# Patient Record
Sex: Female | Born: 1985 | Race: Black or African American | Hispanic: No | Marital: Single | Smoking: Never smoker
Health system: Southern US, Community
[De-identification: ages and names within clinical notes are randomized; demographics above are authoritative.]

## PROBLEM LIST (undated history)

## (undated) DIAGNOSIS — I10 Essential (primary) hypertension: Secondary | ICD-10-CM

## (undated) HISTORY — PX: GASTRIC BYPASS: SHX52

---

## 2007-01-12 ENCOUNTER — Encounter: Admission: RE | Admit: 2007-01-12 | Discharge: 2007-01-12 | Payer: Self-pay | Admitting: Emergency Medicine

## 2007-01-12 IMAGING — US US ABDOMEN COMPLETE
1 series · 14 of 25 positions shown · non-contrast
Comparison: none

CLINICAL DATA: Abdominal pain. 
 ABDOMEN ULTRASOUND:
TECHNIQUE: Complete abdominal ultrasound examination was performed including evaluation of the liver, gallbladder, bile ducts, pancreas, kidneys, spleen, IVC, and abdominal aorta.
 The gallbladder is well seen and no gallstones are noted.  The liver has a normal echogenic pattern.  The common bile duct is normal measuring 4.2 mm in diameter.  The IVC, pancreas, and spleen appear normal.  No hydronephrosis is noted.  The right kidney measures 12.2 cm sagittally with the left kidney measuring 11.4 cm.  The abdominal aorta is normal in caliber.

[Series 1: us abdomen complete · 0.28mm/px · 14 of 56 slices shown]
[im 1/56]
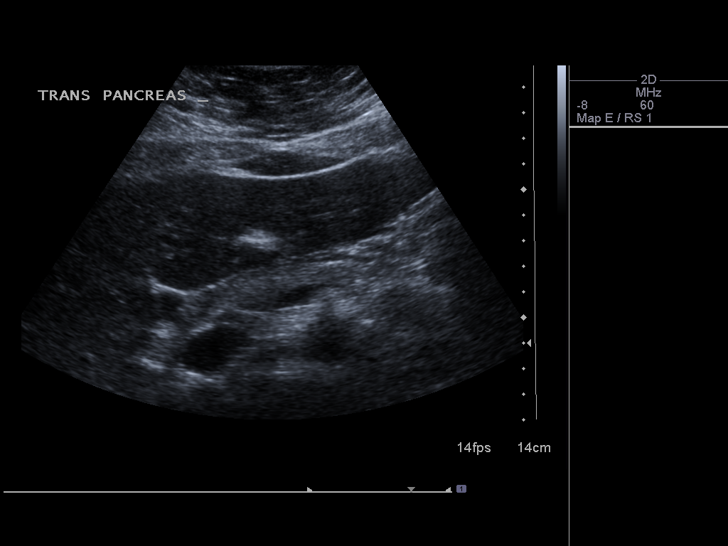
[im 5/56]
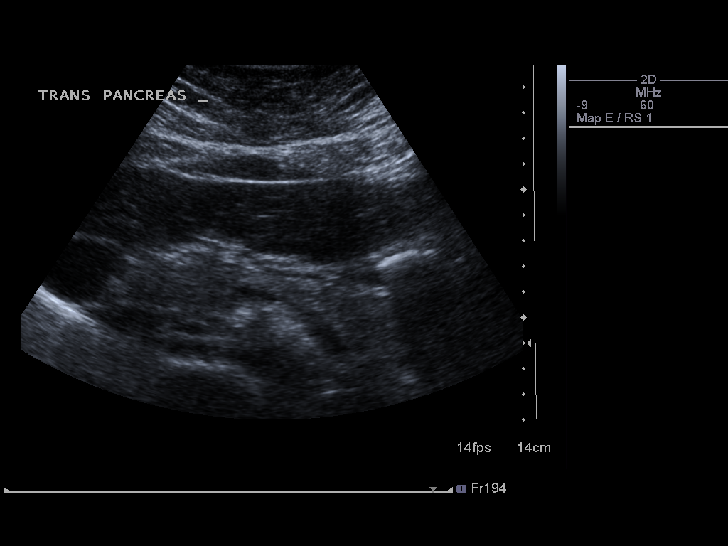
[im 10/56]
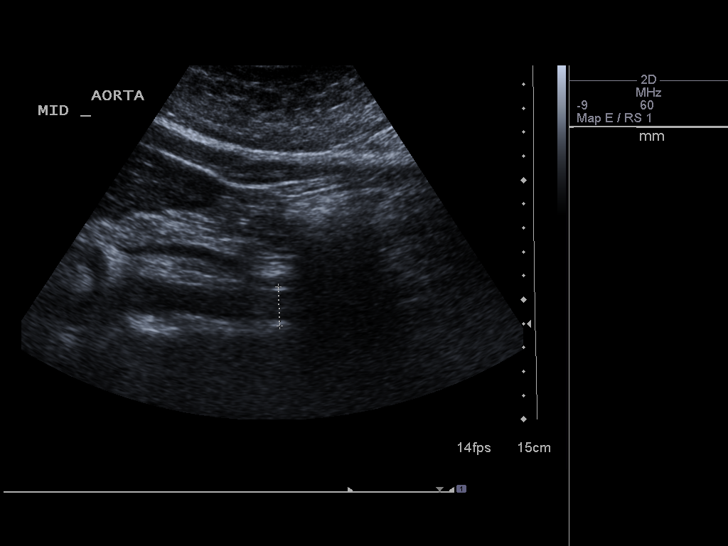
[im 14/56]
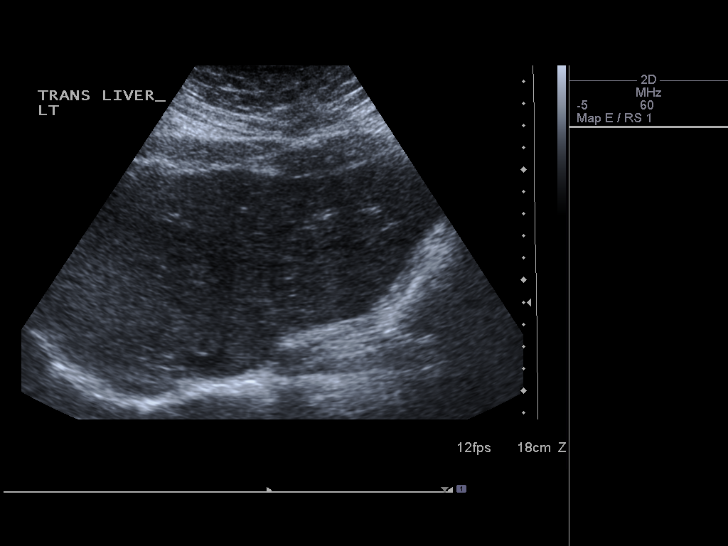
[im 19/56]
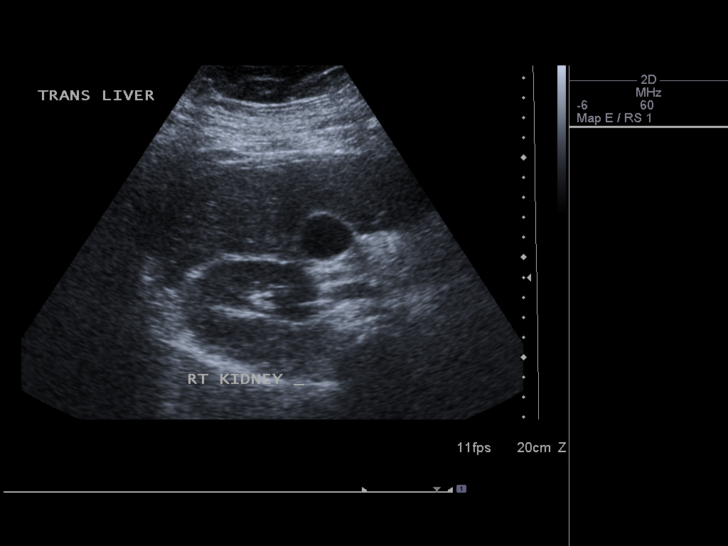
[im 21/56]
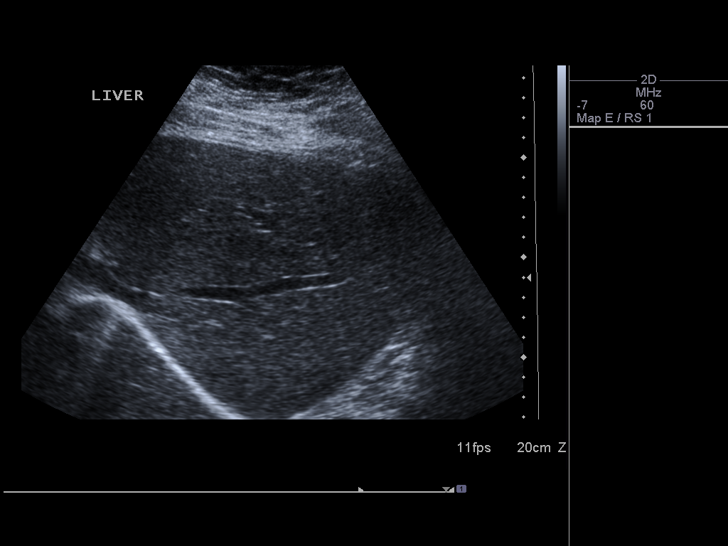
[im 26/56]
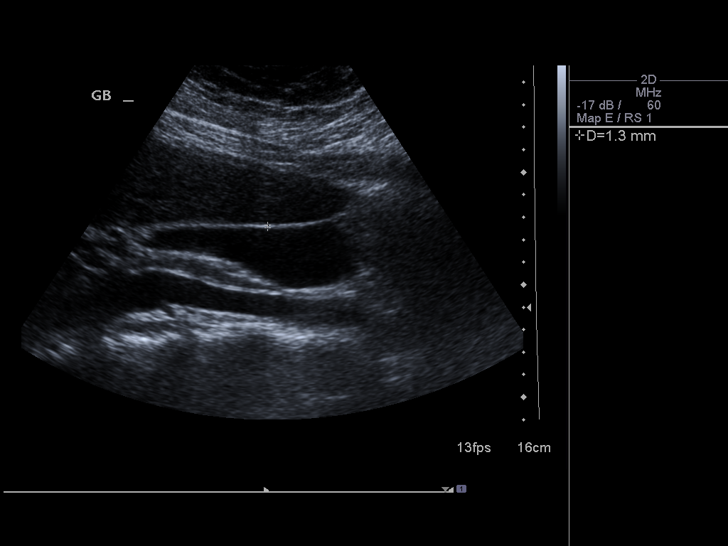
[im 30/56]
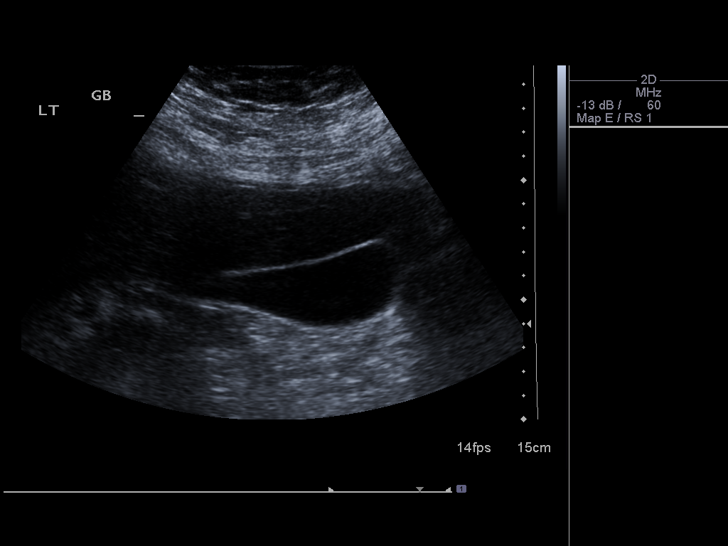
[im 35/56]
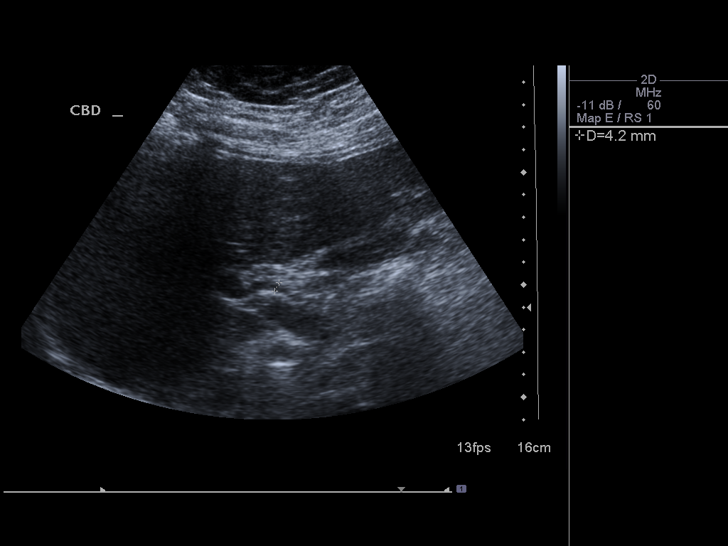
[im 37/56]
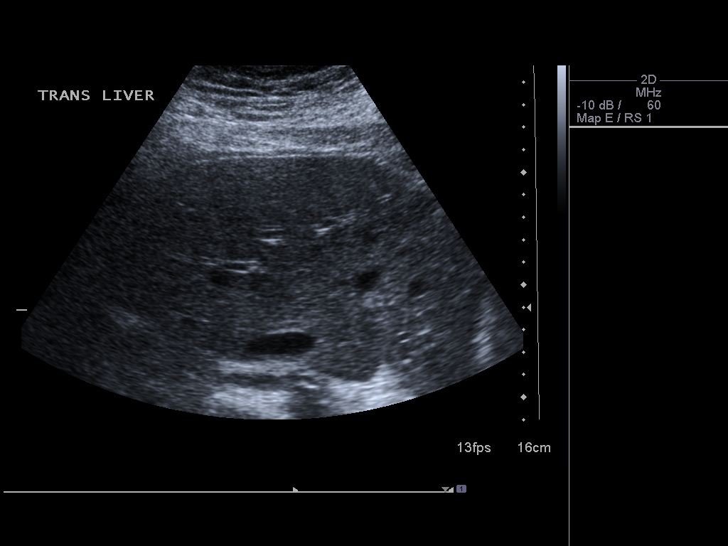
[im 42/56]
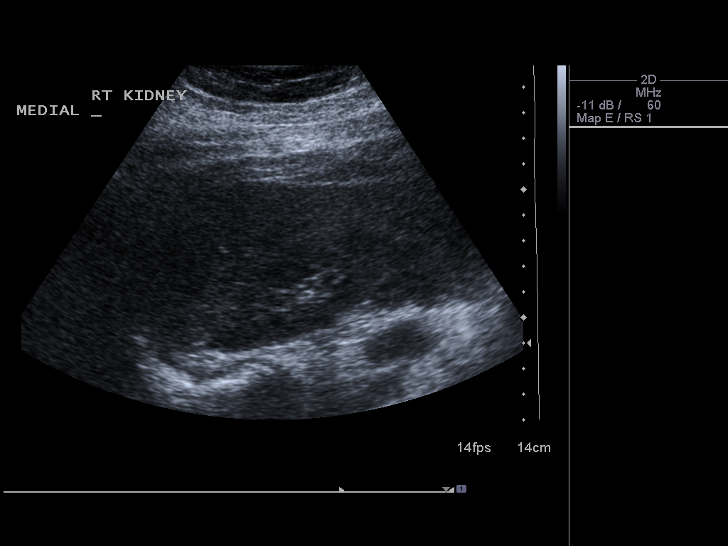
[im 46/56]
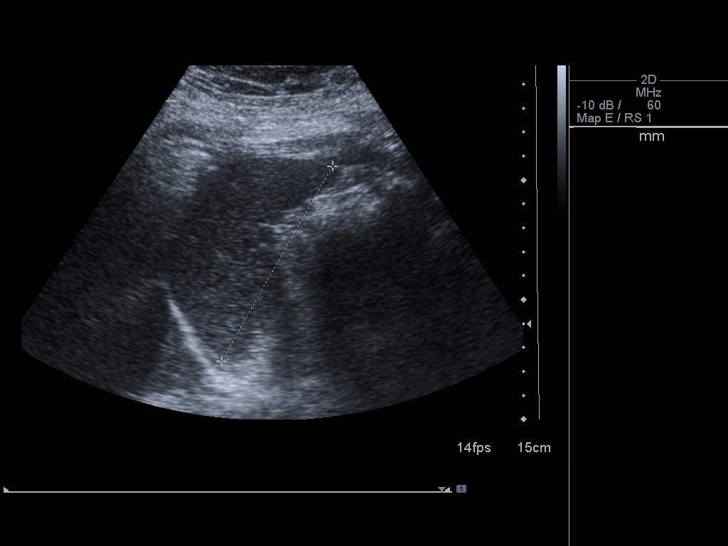
[im 51/56]
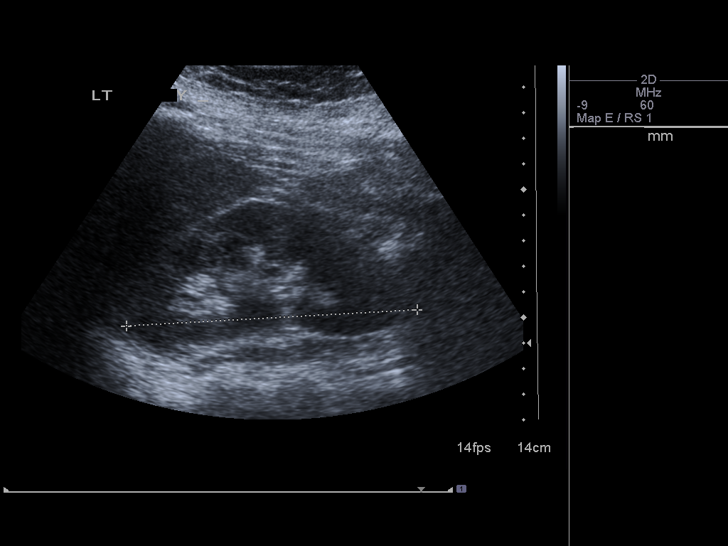
[im 56/56]
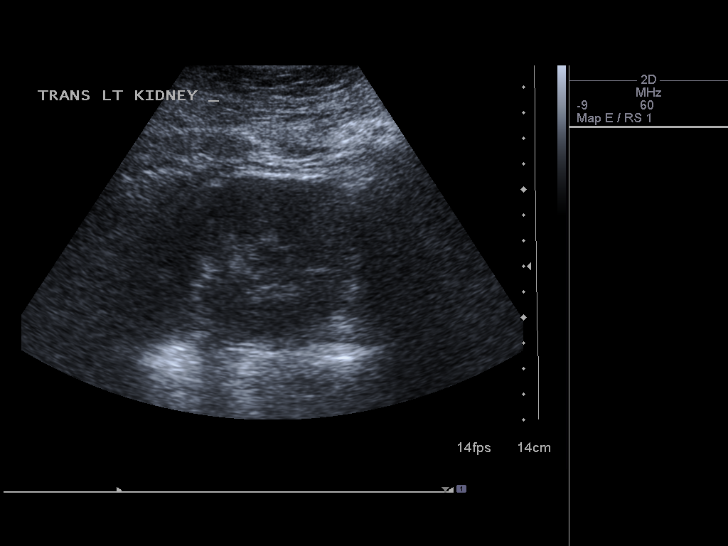

[14 of 25 positions shown; findings below may reference images not displayed]

IMPRESSION: Negative abdominal ultrasound.  No gallstones.

## 2009-04-03 ENCOUNTER — Encounter: Admission: RE | Admit: 2009-04-03 | Discharge: 2009-04-03 | Payer: Self-pay | Admitting: Infectious Diseases

## 2009-04-03 IMAGING — CR DG CHEST 1V
1 series · 1 of 1 positions shown · non-contrast
Comparison: None

CLINICAL DATA: Positive PPD, no symptoms

CHEST - 1 VIEW

[view not recorded]
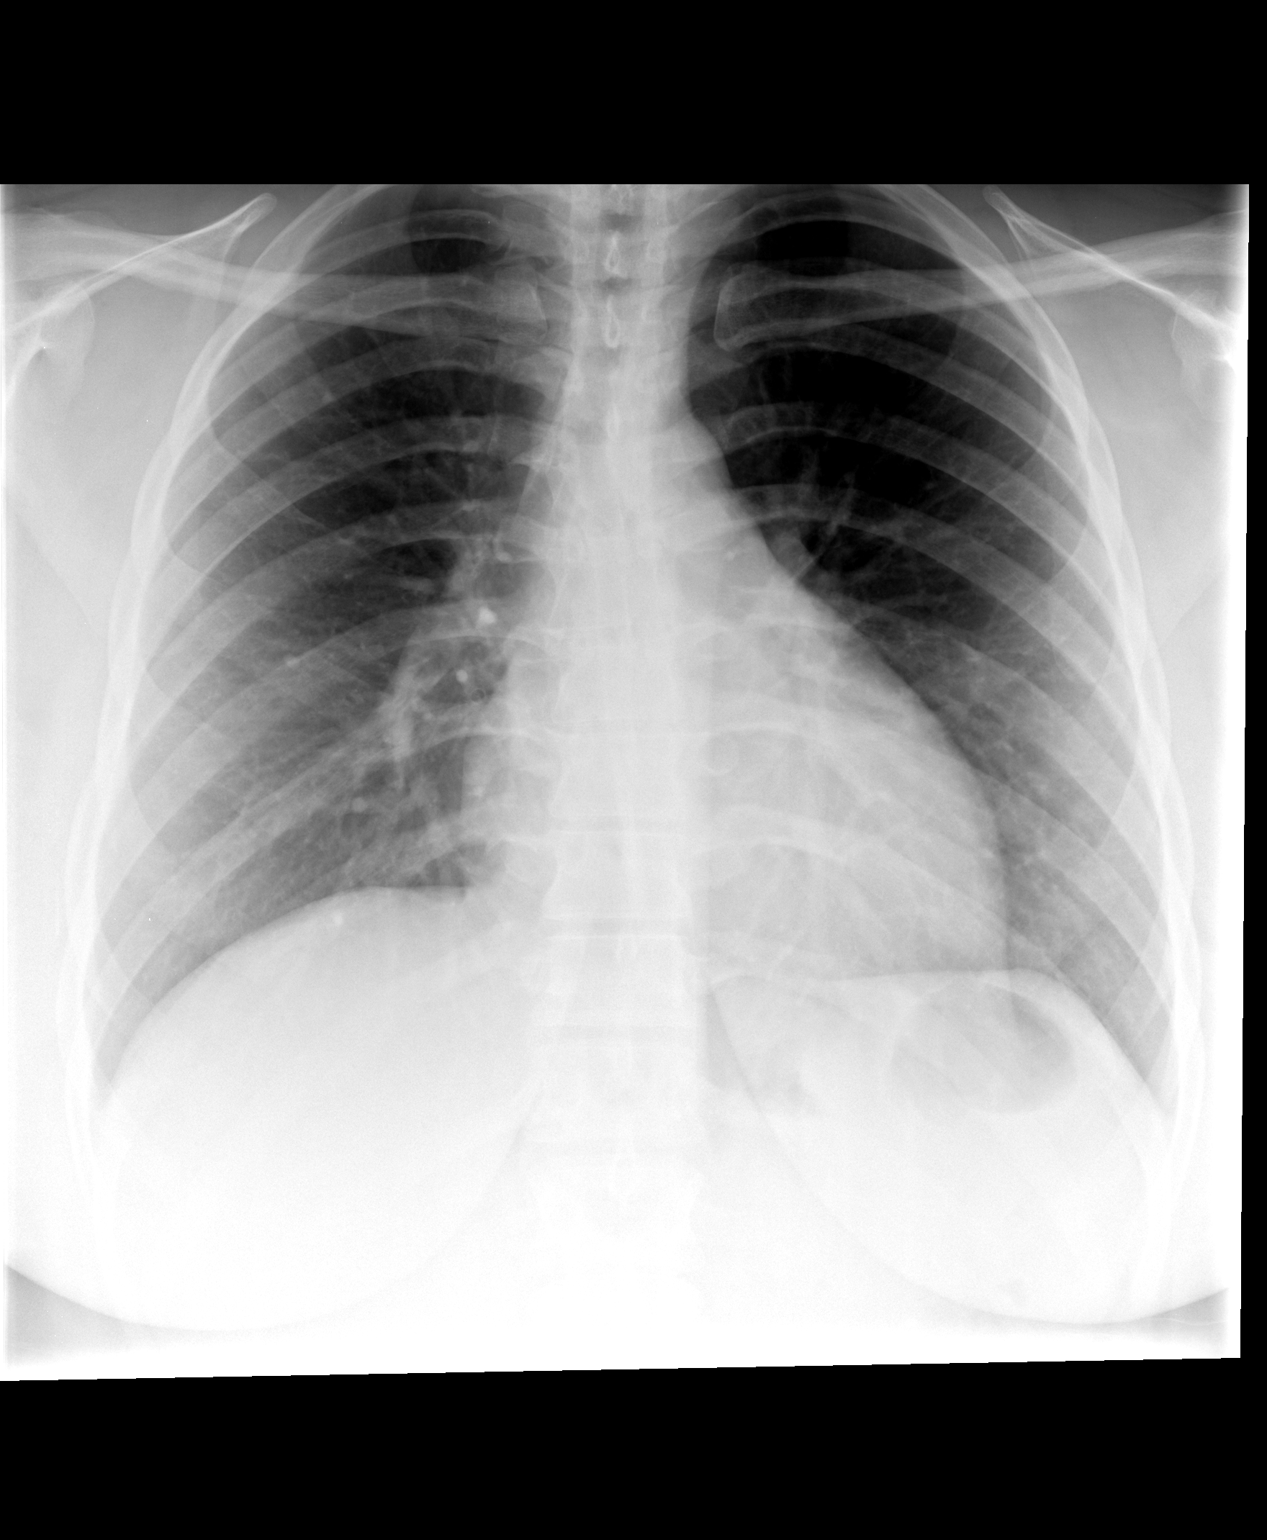

[1 of 1 positions shown; findings below may reference images not displayed]

FINDINGS: No active infiltrate or effusion is seen.  A small nodule
in the right mid lung base possibly in the right upper lung field
probably represent faintly calcified granulomas.  No mediastinal
abnormality is noted.  The heart is within normal limits in size.
No bony abnormality is seen.
IMPRESSION: No active lung disease.  Probable small granuloma in the right mid
lung and possibly in the right upper lung field.

## 2020-07-27 ENCOUNTER — Encounter (HOSPITAL_BASED_OUTPATIENT_CLINIC_OR_DEPARTMENT_OTHER): Payer: Self-pay | Admitting: Emergency Medicine

## 2020-07-27 ENCOUNTER — Other Ambulatory Visit: Payer: Self-pay

## 2020-07-27 ENCOUNTER — Emergency Department (HOSPITAL_BASED_OUTPATIENT_CLINIC_OR_DEPARTMENT_OTHER)
Admission: EM | Admit: 2020-07-27 | Discharge: 2020-07-27 | Disposition: A | Discharge status: Home / Self Care | Payer: BC Managed Care – PPO | Attending: Emergency Medicine | Admitting: Emergency Medicine

## 2020-07-27 DIAGNOSIS — T7840XA Allergy, unspecified, initial encounter: Secondary | ICD-10-CM | POA: Insufficient documentation

## 2020-07-27 MED ORDER — ALBUTEROL SULFATE HFA 108 (90 BASE) MCG/ACT IN AERS
2.0000 | INHALATION_SPRAY | Freq: Once | RESPIRATORY_TRACT | Status: AC
  Administered 2020-07-27: 2 via RESPIRATORY_TRACT
  Filled 2020-07-27: qty 6.7

## 2020-07-27 MED ORDER — EPINEPHRINE 0.3 MG/0.3ML IJ SOAJ
0.3000 mg | INTRAMUSCULAR | 1 refills | Status: AC | PRN
Start: 2020-07-27 — End: ?

## 2020-07-27 NOTE — ED Triage Notes (Signed)
Pt via pov from home with allergic reaction. 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 The reaction started around 4PM; she began to feel itchy and sob and felt her throat swell; then her eyes started running. Pt took 2 benadryl tablets and came here. Pt has hx of the same, used to have an epi pen, but has not had a reaction in several years and gave up her epi pen. Pt alert & oriented, states she is feeling a little better. NAD noted at this time.

## 2020-07-27 NOTE — ED Notes (Signed)
Pt discharged home after verbalizing understanding of discharge instructions; nad noted. 

## 2020-07-27 NOTE — ED Provider Notes (Signed)
[facility] EMERGENCY DEPT Provider Note   CSN: [id number] Arrival date & time: 07/27/20  1633     History Chief Complaint  Patient presents with  . Allergic Reaction    Sara Buckley is a 35 y.o. female.  HPI 35 year old female presents with allergic reaction.  At 4 PM she started noticing a little bit of chest tightness and swelling and itching and redness around her eyes.  This is how her allergic reactions typically occur.  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  She has a little bit of dyspnea.  She had some throat tightness though after taking 1 and then another Benadryl that has resolved.  She is feeling better though not all the way back to normal.  She does not have an EpiPen any longer.  No rash anywhere else.  History reviewed. No pertinent past medical history.  There are no problems to display for this patient.   History reviewed. No pertinent surgical history.   OB History   No obstetric history on file.     History reviewed. No pertinent family history.  Social History   Tobacco Use  . Smoking status: Never Smoker  . Smokeless tobacco: Never Used  Vaping Use  . Vaping Use: Never used  Substance Use Topics  . Alcohol use: Not Currently  . Drug use: Not Currently    Home Medications Prior to Admission medications   Medication Sig Start Date End Date Taking? Authorizing Provider  EPINEPHrine 0.3 mg/0.3 mL IJ SOAJ injection Inject 0.3 mg into the muscle as needed for anaphylaxis. 07/27/20  Yes Pricilla Loveless, MD    Allergies    Patient has no known allergies.  Review of Systems   Review of Systems  HENT: Positive for facial swelling.   Respiratory: Positive for chest tightness and shortness of breath. Negative for wheezing.   Gastrointestinal: Negative for vomiting.  Skin: Positive for rash.  All other systems  reviewed and are negative.   Physical Exam Updated Vital Signs BP (!) 168/95 (BP Location: Right Arm)   Pulse 100   Temp 98.1 F (36.7 C) (Oral)   Resp 20   Ht 5\' 8"  (1.727 m)   Wt (!) 143.3 kg   LMP 07/12/2020 (Approximate)   SpO2 99%   BMI 48.05 kg/m   Physical Exam Vitals and nursing note reviewed.  Constitutional:      General: She is not in acute distress.    Appearance: She is well-developed. She is obese. She is not ill-appearing or diaphoretic.  HENT:     Head: Normocephalic and atraumatic.     Comments: Mild periorbital swelling    Right Ear: External ear normal.     Left Ear: External ear normal.     Nose: Nose normal.     Mouth/Throat:     Comments: Normal tongue/lip size. No obvious oropharyngeal swelling. Normal voice Eyes:     General:        Right eye: No discharge.        Left eye: No discharge.  Cardiovascular:     Rate and Rhythm: Normal rate and regular rhythm.     Heart sounds: Normal heart sounds.  Pulmonary:     Effort: Pulmonary effort is normal.     Breath sounds: Normal breath sounds.     Comments: Mildly decreased BS at bases, no  obvious wheezes Abdominal:     Palpations: Abdomen is soft.     Tenderness: There is no abdominal tenderness.  Skin:    General: Skin is warm and dry.     Findings: No rash.  Neurological:     Mental Status: She is alert.  Psychiatric:        Mood and Affect: Mood is not anxious.     ED Results / Procedures / Treatments   Labs (all labs ordered are listed, but only abnormal results are displayed) Labs Reviewed - No data to display  EKG None  Radiology No results found.  Procedures Procedures   Medications Ordered in ED Medications  albuterol (VENTOLIN HFA) 108 (90 Base) MCG/ACT inhaler 2 puff (2 puffs Inhalation Given 07/27/20 1711)    ED Course  I have reviewed the triage vital signs and the nursing notes.  Pertinent labs & imaging results that were available during my care of the patient  were reviewed by me and considered in my medical decision making (see chart for details).    MDM Rules/Calculators/A&P                          Patient is feeling better after observation in the ED.  She declined any other type of meds such as prednisone.  I do not hear any overt wheezing but she want to try an inhaler because of her chest tightness.  We discussed EpiPen and she would like to have a prescription given to her.  I think this is reasonable given her not allergy.  I do not think she is in anaphylaxis.  She would like to decline steroids and we discussed continue to use Benadryl as needed.  She has been observed now for over an hour and feels well enough for discharge as she has continued to improve. Final Clinical Impression(s) / ED Diagnoses Final diagnoses:  Allergic reaction, initial encounter    Rx / DC Orders ED Discharge Orders         Ordered    EPINEPHrine 0.3 mg/0.3 mL IJ SOAJ injection  As needed        07/27/20 1804           Pricilla Loveless, MD 07/27/20 1806

## 2020-07-27 NOTE — Discharge Instructions (Signed)
Continue to take Benadryl as needed for rash, itching, swelling.  If you develop trouble breathing, inability to swallow, throat closing sensation, or any other new/concerning symptoms then return to the ER for evaluation.

## 2021-04-06 ENCOUNTER — Other Ambulatory Visit: Payer: Self-pay

## 2021-04-06 ENCOUNTER — Encounter (HOSPITAL_BASED_OUTPATIENT_CLINIC_OR_DEPARTMENT_OTHER): Payer: Self-pay

## 2021-04-06 DIAGNOSIS — R202 Paresthesia of skin: Secondary | ICD-10-CM | POA: Diagnosis present

## 2021-04-06 DIAGNOSIS — R471 Dysarthria and anarthria: Secondary | ICD-10-CM | POA: Diagnosis present

## 2021-04-06 DIAGNOSIS — R1310 Dysphagia, unspecified: Secondary | ICD-10-CM | POA: Diagnosis present

## 2021-04-06 DIAGNOSIS — I10 Essential (primary) hypertension: Secondary | ICD-10-CM | POA: Diagnosis present

## 2021-04-06 DIAGNOSIS — R4781 Slurred speech: Secondary | ICD-10-CM | POA: Diagnosis present

## 2021-04-06 DIAGNOSIS — R200 Anesthesia of skin: Secondary | ICD-10-CM | POA: Diagnosis present

## 2021-04-06 DIAGNOSIS — Z9884 Bariatric surgery status: Secondary | ICD-10-CM

## 2021-04-06 DIAGNOSIS — R55 Syncope and collapse: Secondary | ICD-10-CM | POA: Diagnosis not present

## 2021-04-06 DIAGNOSIS — R4701 Aphasia: Secondary | ICD-10-CM | POA: Diagnosis present

## 2021-04-06 DIAGNOSIS — Z6841 Body Mass Index (BMI) 40.0 and over, adult: Secondary | ICD-10-CM

## 2021-04-06 DIAGNOSIS — Z823 Family history of stroke: Secondary | ICD-10-CM

## 2021-04-06 DIAGNOSIS — E785 Hyperlipidemia, unspecified: Secondary | ICD-10-CM | POA: Diagnosis present

## 2021-04-06 DIAGNOSIS — Z87892 Personal history of anaphylaxis: Secondary | ICD-10-CM

## 2021-04-06 DIAGNOSIS — Z91010 Allergy to peanuts: Secondary | ICD-10-CM

## 2021-04-06 DIAGNOSIS — Z20822 Contact with and (suspected) exposure to covid-19: Secondary | ICD-10-CM | POA: Diagnosis present

## 2021-04-06 DIAGNOSIS — I639 Cerebral infarction, unspecified: Principal | ICD-10-CM | POA: Diagnosis present

## 2021-04-06 DIAGNOSIS — R29810 Facial weakness: Secondary | ICD-10-CM | POA: Diagnosis present

## 2021-04-06 DIAGNOSIS — R29700 NIHSS score 0: Secondary | ICD-10-CM | POA: Diagnosis present

## 2021-04-06 DIAGNOSIS — Z888 Allergy status to other drugs, medicaments and biological substances status: Secondary | ICD-10-CM

## 2021-04-06 NOTE — ED Triage Notes (Signed)
Patient here POV from Home with Numbness.  At 1100 she began to experience Left-Sided Facial Numbness/Tingling that she originally attributed to an Allergic Reaction. Patient attempted to drink Fluids with Benadryl but noticed Difficulty Swallowing.   No Symptoms at this Time. Headache this AM that has since subsided. No Extremity Numbness. NIH 0 at this Time. No Neurological Deficits noted.  No N/V/D. No Fevers.  NAD Noted during Triage. A&Ox4. GCS 15. Ambulatory.

## 2021-04-07 ENCOUNTER — Inpatient Hospital Stay (HOSPITAL_BASED_OUTPATIENT_CLINIC_OR_DEPARTMENT_OTHER)
Admission: EM | Admit: 2021-04-07 | Discharge: 2021-04-09 | DRG: 065 | Disposition: A | Discharge status: Home / Self Care | Payer: BC Managed Care – PPO | Attending: Internal Medicine | Admitting: Internal Medicine

## 2021-04-07 ENCOUNTER — Emergency Department (HOSPITAL_COMMUNITY): Payer: BC Managed Care – PPO

## 2021-04-07 ENCOUNTER — Observation Stay (HOSPITAL_COMMUNITY): Payer: BC Managed Care – PPO

## 2021-04-07 ENCOUNTER — Encounter (HOSPITAL_COMMUNITY): Payer: Self-pay | Admitting: Internal Medicine

## 2021-04-07 ENCOUNTER — Emergency Department (HOSPITAL_BASED_OUTPATIENT_CLINIC_OR_DEPARTMENT_OTHER): Payer: BC Managed Care – PPO

## 2021-04-07 DIAGNOSIS — I639 Cerebral infarction, unspecified: Secondary | ICD-10-CM | POA: Diagnosis not present

## 2021-04-07 DIAGNOSIS — R55 Syncope and collapse: Secondary | ICD-10-CM | POA: Insufficient documentation

## 2021-04-07 DIAGNOSIS — I6389 Other cerebral infarction: Secondary | ICD-10-CM | POA: Diagnosis not present

## 2021-04-07 DIAGNOSIS — I10 Essential (primary) hypertension: Secondary | ICD-10-CM | POA: Diagnosis present

## 2021-04-07 HISTORY — DX: Morbid (severe) obesity due to excess calories: E66.01

## 2021-04-07 HISTORY — DX: Essential (primary) hypertension: I10

## 2021-04-07 LAB — BASIC METABOLIC PANEL
Anion gap: 10 (ref 5–15)
BUN: 14 mg/dL (ref 6–20)
CO2: 25 mmol/L (ref 22–32)
Calcium: 8.6 mg/dL — ABNORMAL LOW (ref 8.9–10.3)
Chloride: 104 mmol/L (ref 98–111)
Creatinine, Ser: 0.81 mg/dL (ref 0.44–1.00)
GFR, Estimated: >60 mL/min (ref >60)
Glucose, Bld: 90 mg/dL (ref 70–99)
Potassium: 3.4 mmol/L — ABNORMAL LOW (ref 3.5–5.1)
Sodium: 139 mmol/L (ref 135–145)

## 2021-04-07 LAB — CBC WITH DIFFERENTIAL/PLATELET
Abs Immature Granulocytes: 0.02 10*3/uL (ref 0.00–0.07)
Basophils Absolute: 0 10*3/uL (ref 0.0–0.1)
Basophils Relative: 0 %
Eosinophils Absolute: 0 10*3/uL (ref 0.0–0.5)
Eosinophils Relative: 1 %
HCT: 34 % — ABNORMAL LOW (ref 36.0–46.0)
Hemoglobin: 10.4 g/dL — ABNORMAL LOW (ref 12.0–15.0)
Immature Granulocytes: 0 %
Lymphocytes Relative: 38 %
Lymphs Abs: 3.1 10*3/uL (ref 0.7–4.0)
MCH: 25.1 pg — ABNORMAL LOW (ref 26.0–34.0)
MCHC: 30.6 g/dL (ref 30.0–36.0)
MCV: 81.9 fL (ref 80.0–100.0)
Monocytes Absolute: 0.4 10*3/uL (ref 0.1–1.0)
Monocytes Relative: 5 %
Neutro Abs: 4.5 10*3/uL (ref 1.7–7.7)
Neutrophils Relative %: 56 %
Platelets: 275 10*3/uL (ref 150–400)
RBC: 4.15 MIL/uL (ref 3.87–5.11)
RDW: 14.7 % (ref 11.5–15.5)
WBC: 8 10*3/uL (ref 4.0–10.5)
nRBC: 0 % (ref 0.0–0.2)

## 2021-04-07 LAB — RESP PANEL BY RT-PCR (FLU A&B, COVID) ARPGX2
Influenza A by PCR: NEGATIVE
Influenza B by PCR: NEGATIVE
SARS Coronavirus 2 by RT PCR: NEGATIVE

## 2021-04-07 LAB — HCG, SERUM, QUALITATIVE: Preg, Serum: NEGATIVE

## 2021-04-07 LAB — ECHOCARDIOGRAM COMPLETE BUBBLE STUDY
Area-P 1/2: 3.48 cm2
S' Lateral: 3.2 cm

## 2021-04-07 IMAGING — MR MR HEAD WO/W CM
7 series · 48 of 48 positions shown · IV contrast (gadavist)
Comparison: Head CT [IP] hours today.

CLINICAL DATA: 35-year-old female with headache and neurologic
deficit. Query demyelinating disease or venous sinus thrombosis.

EXAM:
MRI HEAD WITHOUT AND WITH CONTRAST
MR VENOGRAM HEAD WITHOUT AND WITH CONTRAST
TECHNIQUE: Multiplanar, multi-echo pulse sequences of the brain and surrounding
structures were acquired without and with intravenous contrast.
Angiographic images of the intracranial venous structures were
acquired using MRV technique without and with intravenous contrast.
CONTRAST:  10mL GADAVIST GADOBUTROL 1 MMOL/ML IV SOLN

[Series 5: DWI · axial · 3.0mm · 0.92mm/px · z∈[-75,+89]mm · 15 of 112 slices shown (1 of 4)]
[im 1/112]
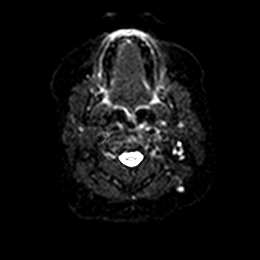
[im 8/112]
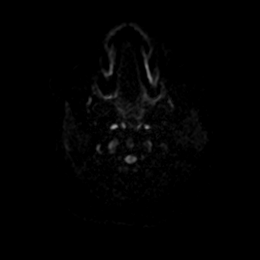
[im 16/112]
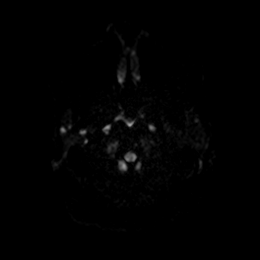
[im 24/112]
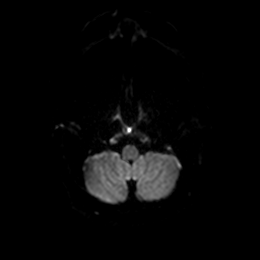
[im 32/112]
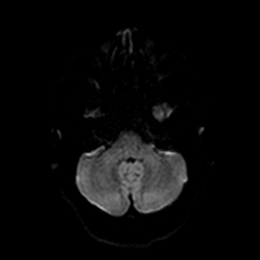
[im 40/112]
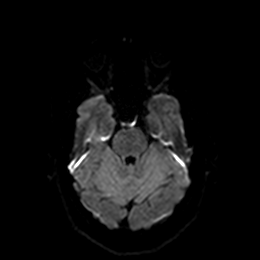
[im 48/112]
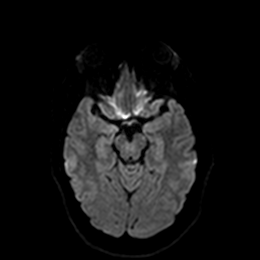
[im 56/112]
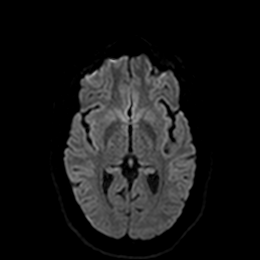
[im 64/112]
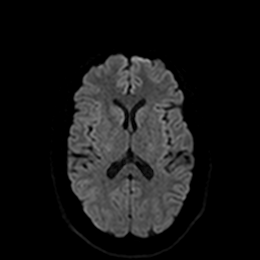
[im 72/112]
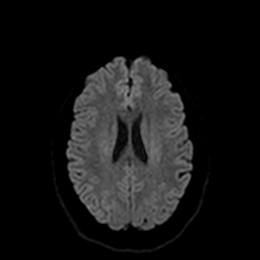
[im 80/112]
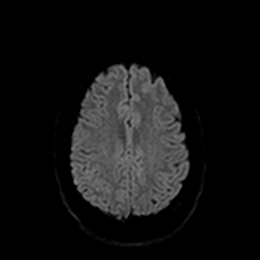
[im 88/112]
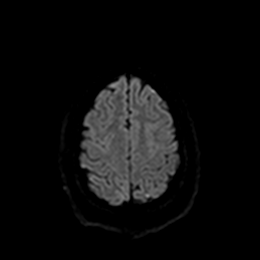
[im 96/112]
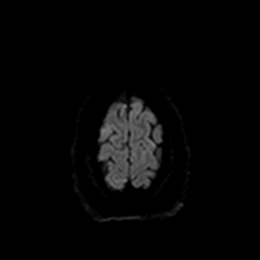
[im 104/112]
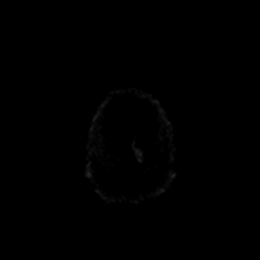
[im 112/112]
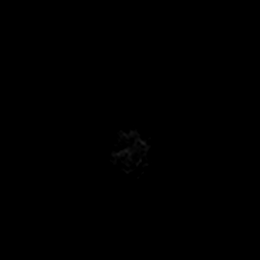

[Series 6: DWI · axial · 3.0mm · 0.92mm/px · z∈[-75,+89]mm · 7 of 56 slices shown (2 of 4)]
[im 1/56]
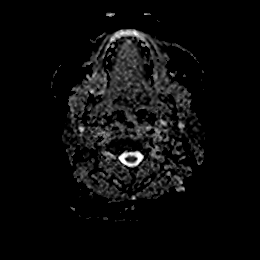
[im 10/56]
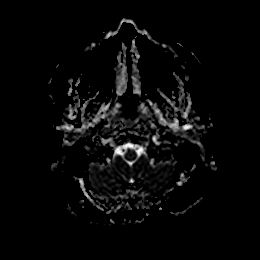
[im 19/56]
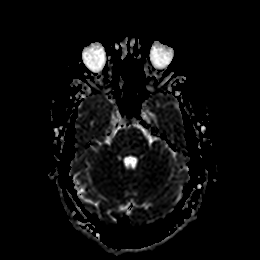
[im 28/56]
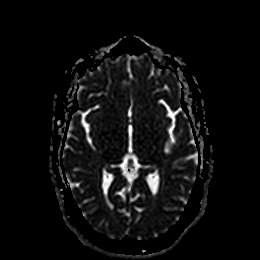
[im 37/56]
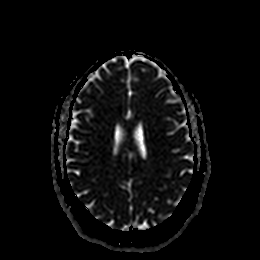
[im 46/56]
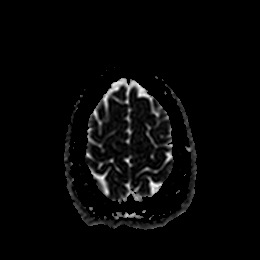
[im 56/56]
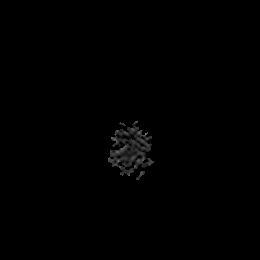

[Series 7: DWI · coronal · 4.0mm · 0.88mm/px · 10 of 82 slices shown (3 of 4)]
[im 1/82]
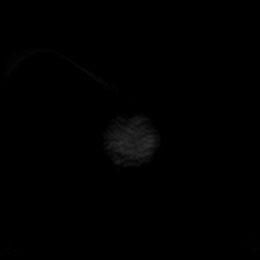
[im 10/82]
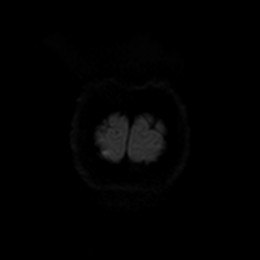
[im 19/82]
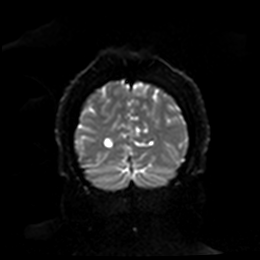
[im 28/82]
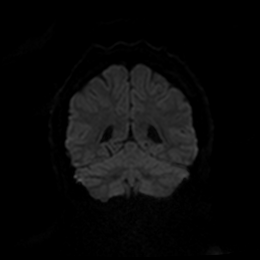
[im 37/82]
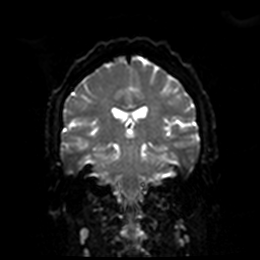
[im 46/82]
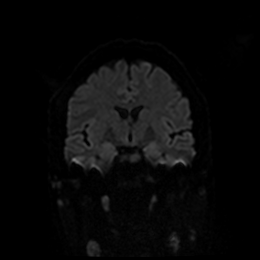
[im 55/82]
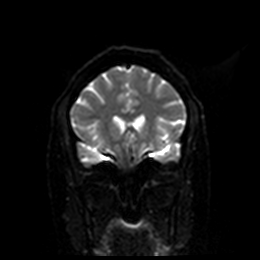
[im 64/82]
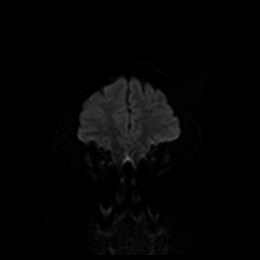
[im 73/82]
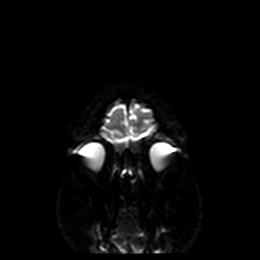
[im 82/82]
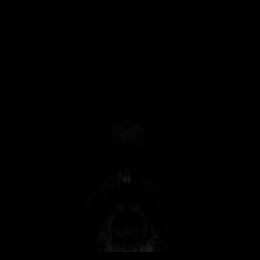

[Series 8: DWI · coronal · 4.0mm · 0.88mm/px · 5 of 41 slices shown (4 of 4)]
[im 1/41]
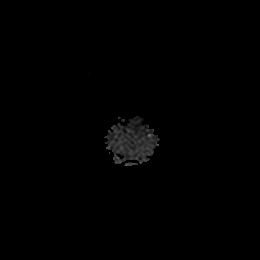
[im 11/41]
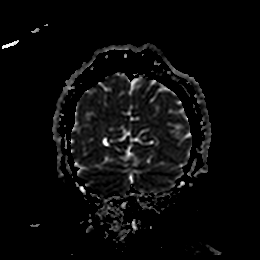
[im 21/41]
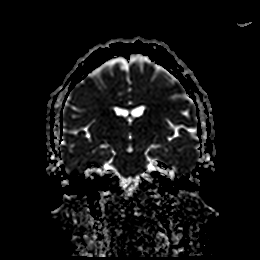
[im 31/41]
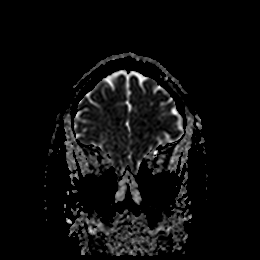
[im 41/41]
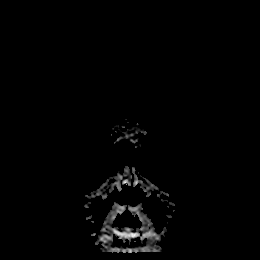

[Series 9: T1 · sagittal · 5.0mm · 0.78mm/px · 3 of 27 slices shown]
[im 1/27]
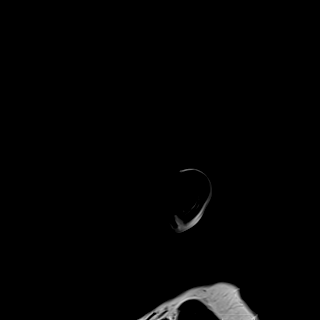
[im 14/27]
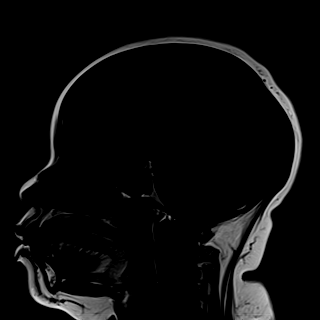
[im 27/27]
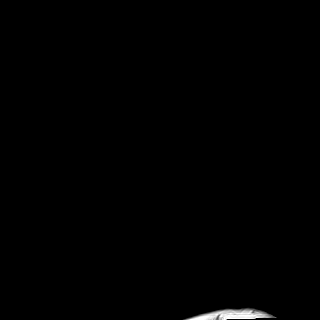

[Series 10: T2 · axial · 5.0mm · 0.75mm/px · z∈[-73,+94]mm · 4 of 29 slices shown]
[im 1/29]
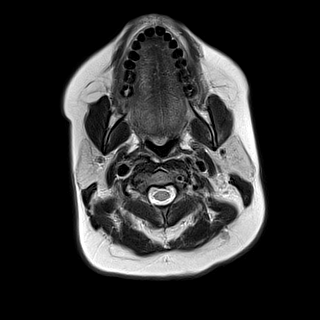
[im 10/29]
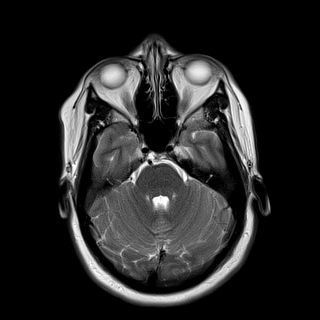
[im 19/29]
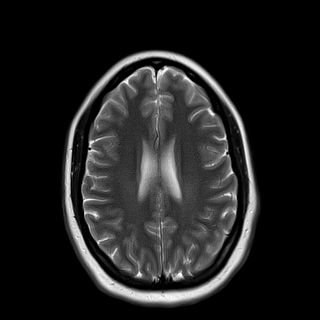
[im 29/29]
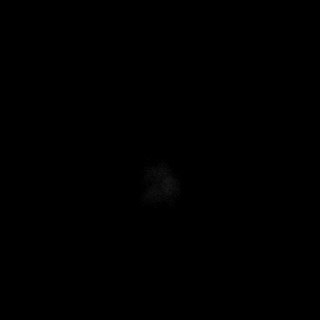

[Series 11: FLAIR · axial · 5.0mm · 0.47mm/px · z∈[-76,+92]mm · 4 of 29 slices shown]
[im 1/29]
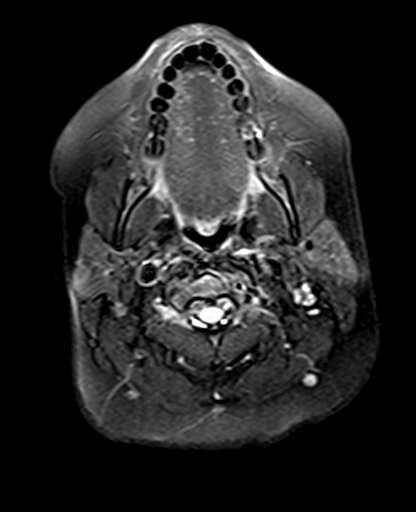
[im 10/29]
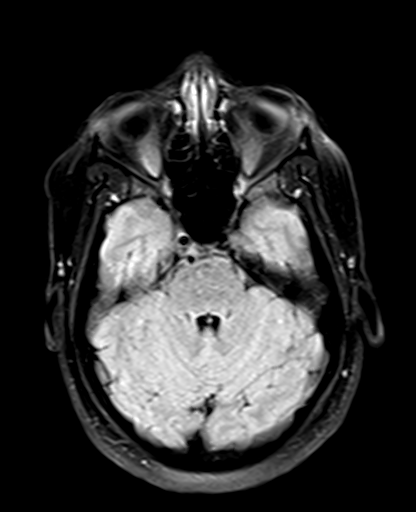
[im 19/29]
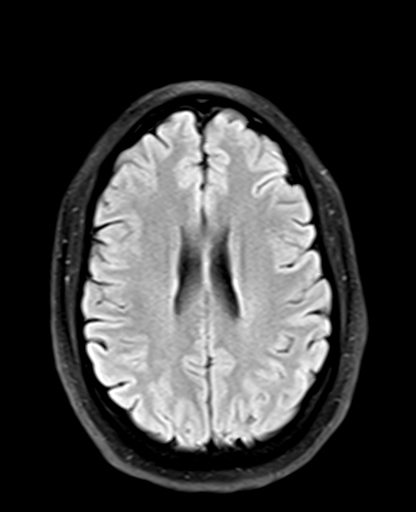
[im 29/29]
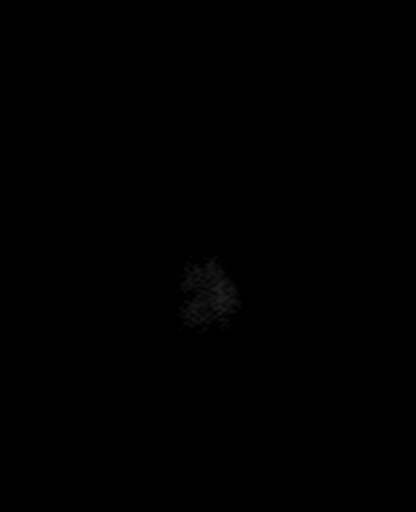

[48 of 48 positions shown; findings below may reference images not displayed]

FINDINGS: MRI HEAD WITHOUT AND WITH CONTRAST

Brain: Small focus of cortical restricted diffusion (approximately
10 mm series 5, image 98) in the right middle frontal gyrus (series
7, image 65), with no convincing T2 or FLAIR hyperintensity at this
time (series 11, image 21). But there is a trace focus of
susceptibility there on series 2, image 77. No corresponding CT
abnormality earlier today. Following contrast, no abnormal
enhancement.

No other convincing diffusion abnormality.

No midline shift, mass effect, evidence of mass lesion,
ventriculomegaly, extra-axial collection or acute intracranial
hemorrhage. Cervicomedullary junction and pituitary are within
normal limits. Normal cerebral volume.

Aside from the abnormal DWI, gray and white matter signal is within
normal limits throughout the brain. Only minimal nonspecific white
matter T2 and FLAIR hyperintensity (posterior left corona radiata
series 11, image 18). No chronic cortical encephalomalacia. No
abnormal enhancement identified. No dural thickening identified.

Vascular: Major dural venous sinuses seem to be enhancing and
patent. See dedicated MRV findings below. Major intracranial
vascular flow voids are preserved.

Skull and upper cervical spine: Negative visible cervical spine.
Visualized bone marrow signal is within normal limits.

Sinuses/Orbits: Negative.  Paranasal sinuses and mastoids are clear.

Other: Visible internal auditory structures appear normal.

MR VENOGRAM HEAD WITHOUT AND WITH CONTRAST

Pre contrast 2D and 3D intracranial MRV. Preserved flow signal in
the superior sagittal sinus, torcula, straight sinus, vein of ARK,
internal cerebral veins, bilateral transverse sinuses, sigmoid
sinuses and IJ bulbs (right transverse, sigmoid, and IJ appear
dominant).

Postcontrast images demonstrate expected enhancement of those dural
venous sinuses, also the cavernous sinus, and major draining
cortical veins.
IMPRESSION: 1. Solitary small 10 mm cortical area of abnormal diffusion and SWI
in the right middle frontal gyrus. This is most suggestive of acute
cortical ischemia, but there is no associated T2/FLAIR signal
abnormality. No abnormal enhancement or dural thickening.

2. Otherwise normal MRI appearance of the brain. No evidence of
white matter demyelinating disease.

3. Normal intracranial MRV. No evidence of dural venous sinus
thrombosis.

## 2021-04-07 IMAGING — MR MR HEAD WO/W CM
5 of 11 series · 12 of 48 positions shown · IV contrast (gadavist)
Comparison: Head CT [IP] hours today.

CLINICAL DATA: 35-year-old female with headache and neurologic
deficit. Query demyelinating disease or venous sinus thrombosis.

EXAM:
MRI HEAD WITHOUT AND WITH CONTRAST
MR VENOGRAM HEAD WITHOUT AND WITH CONTRAST
TECHNIQUE: Multiplanar, multi-echo pulse sequences of the brain and surrounding
structures were acquired without and with intravenous contrast.
Angiographic images of the intracranial venous structures were
acquired using MRV technique without and with intravenous contrast.
CONTRAST:  10mL GADAVIST GADOBUTROL 1 MMOL/ML IV SOLN

[Series 2: (person_name) · axial · 3.0mm · 0.47mm/px · z∈[-91,-39]mm · 2 of 108 slices shown]
[im 1/108]
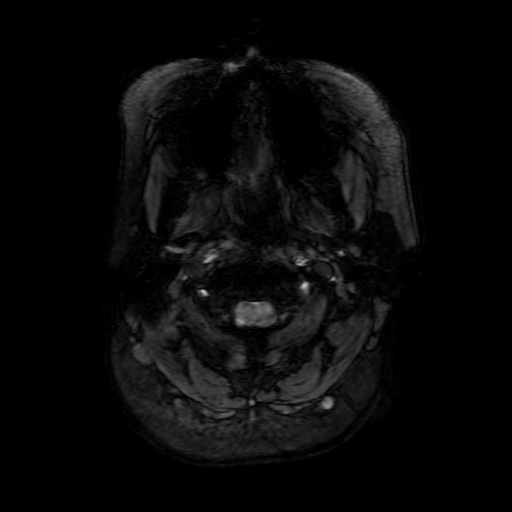
[im 36/108]
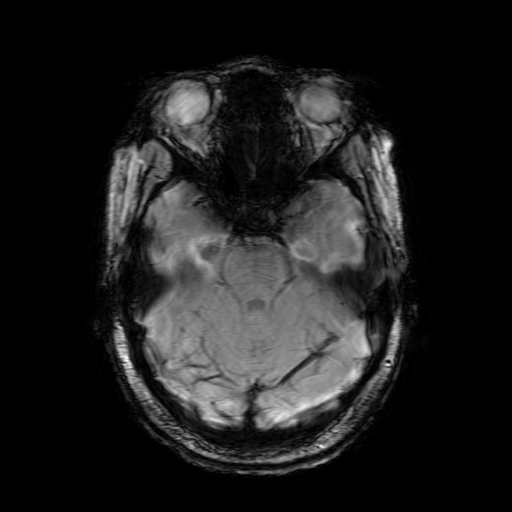

[Series 4: FLAIR · sagittal · 5.0mm · 0.23mm/px · 1 of 28 slices shown (1 of 2)]
[im 1/28]
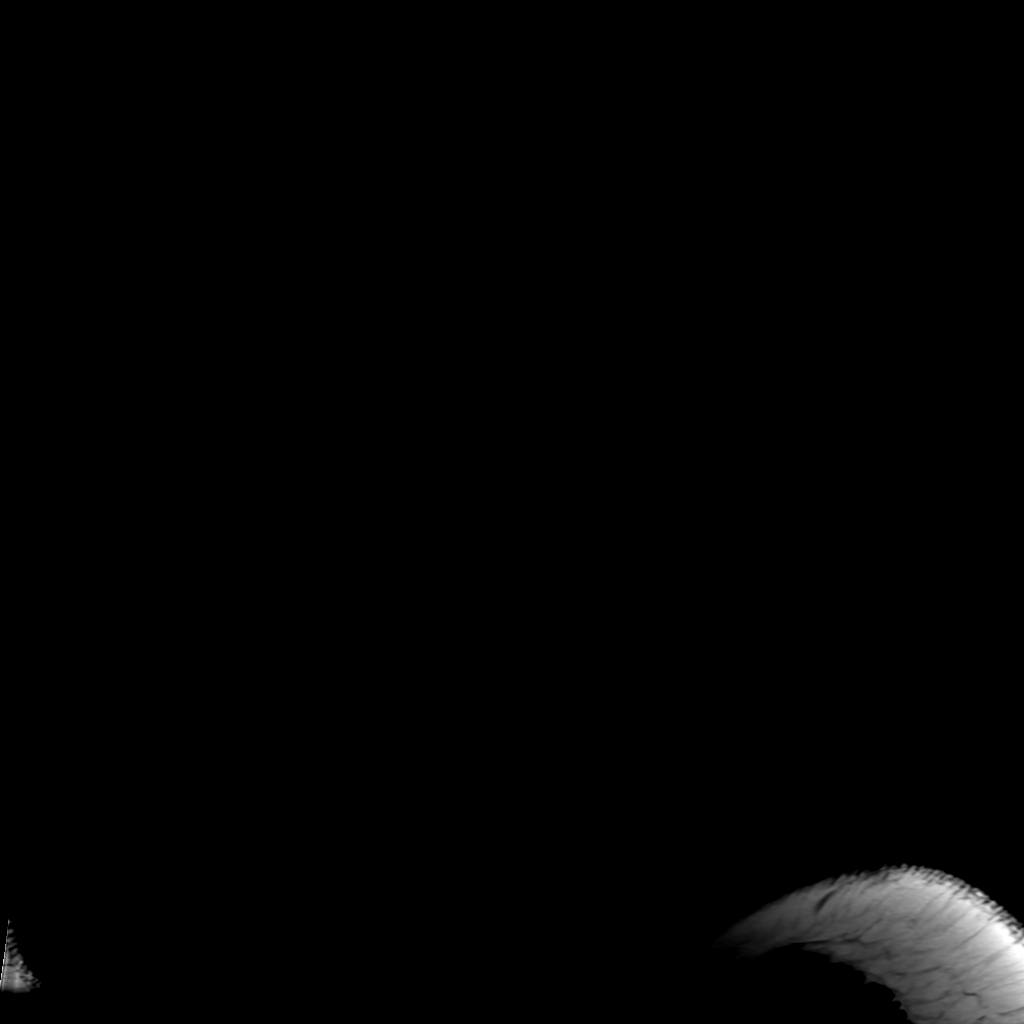

[Series 5: FLAIR · sagittal · 1.6mm · 0.47mm/px · 7 of 240 slices shown (2 of 2)]
[im 1/240]
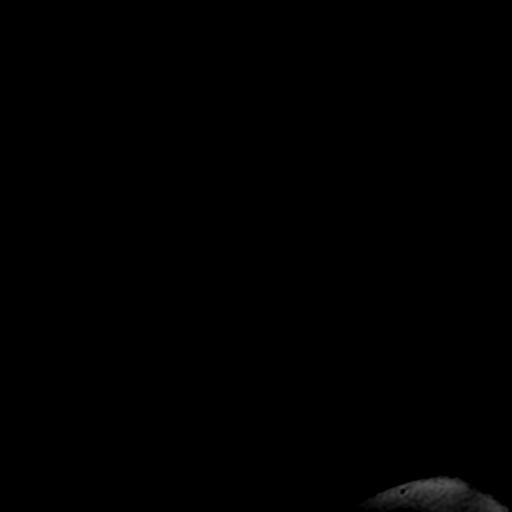
[im 40/240]
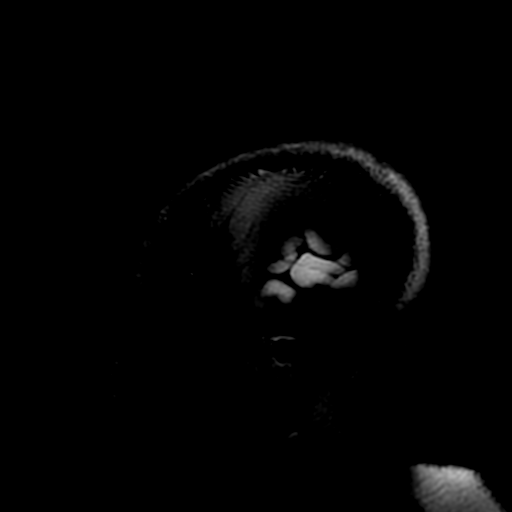
[im 80/240]
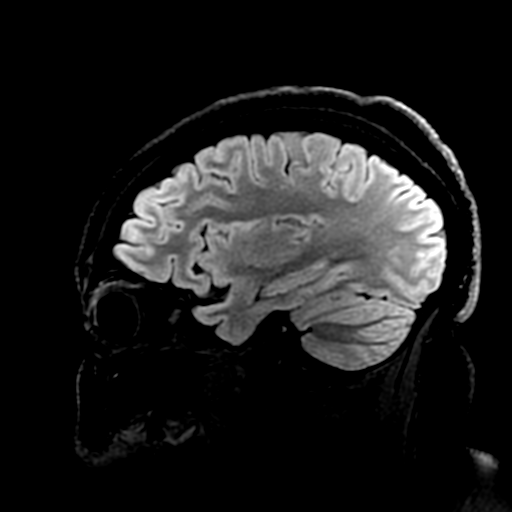
[im 120/240]
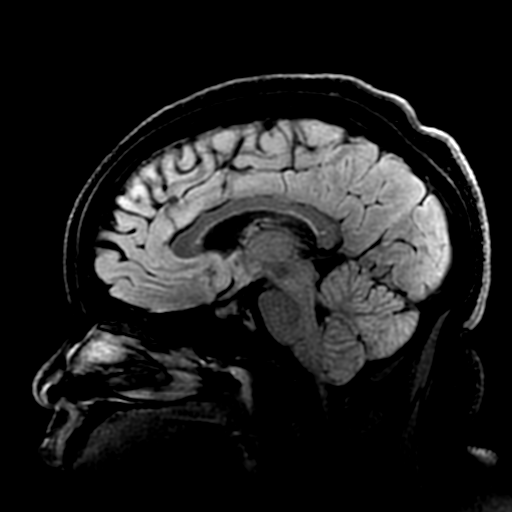
[im 160/240]
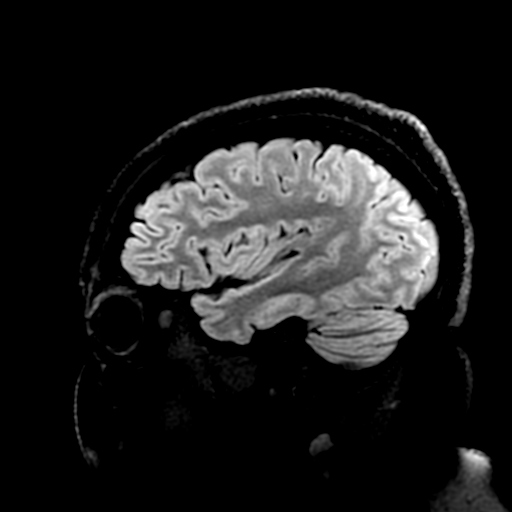
[im 200/240]
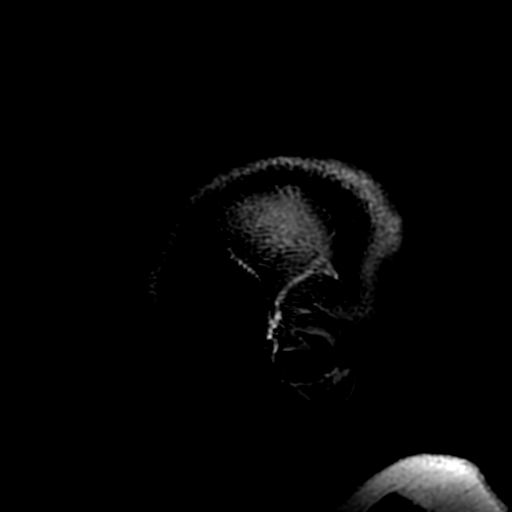
[im 240/240]
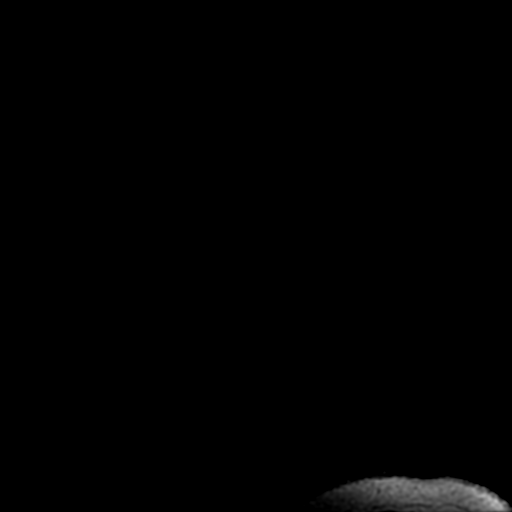

[Series 8: T2 post-contrast · coronal · 5.0mm · 0.20mm/px · 1 of 34 slices shown]
[im 1/34]
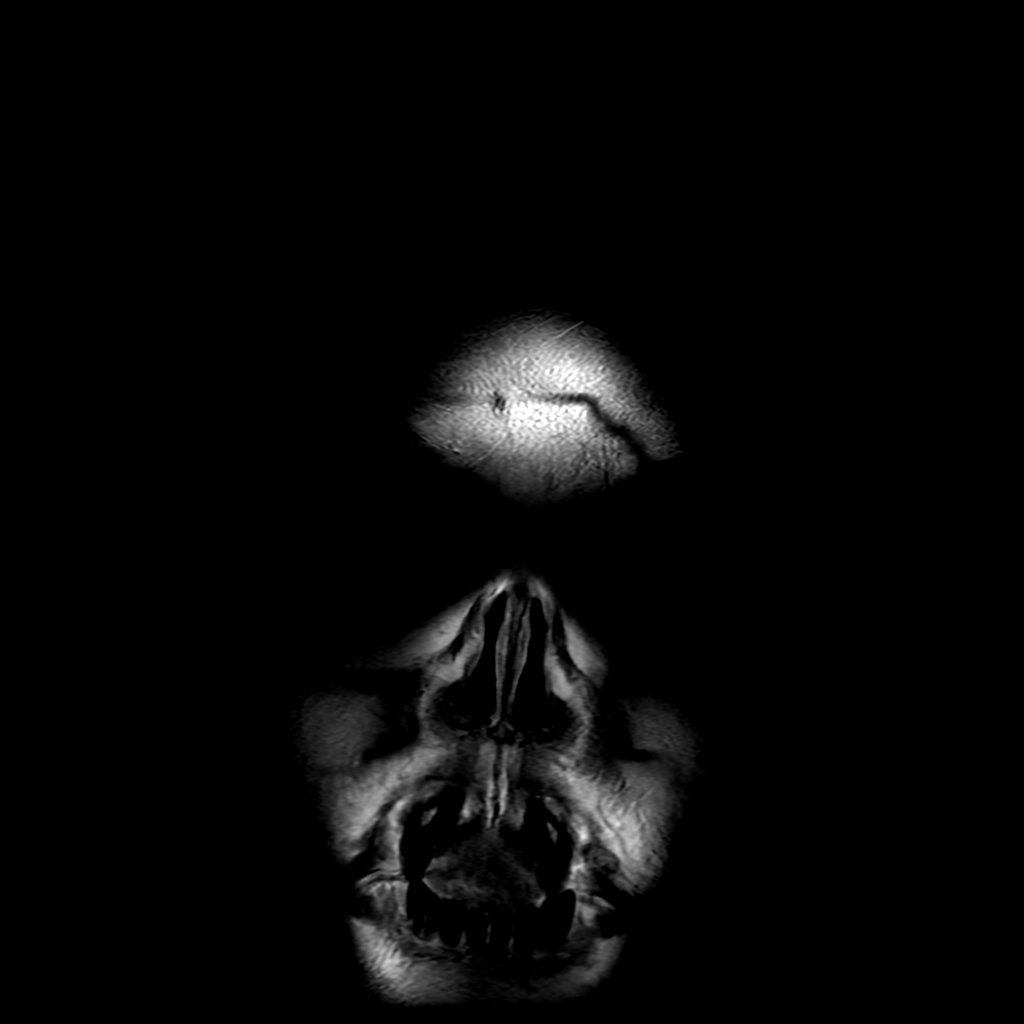

[Series 11: T1 · coronal · 5.0mm · 0.43mm/px · 1 of 34 slices shown]
[im 1/34]
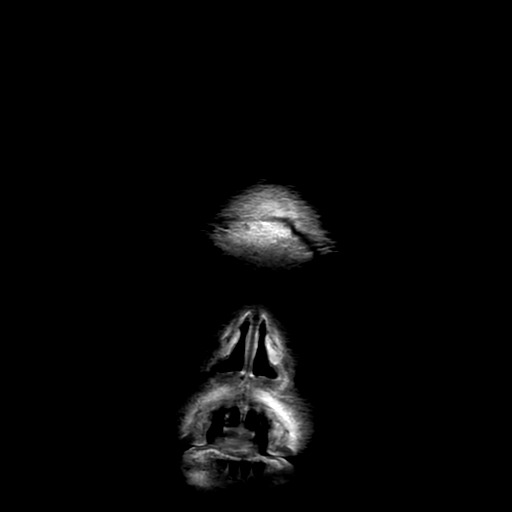

[12 of 48 positions shown; findings below may reference images not displayed]

FINDINGS: MRI HEAD WITHOUT AND WITH CONTRAST

Brain: Small focus of cortical restricted diffusion (approximately
10 mm series 5, image 98) in the right middle frontal gyrus (series
7, image 65), with no convincing T2 or FLAIR hyperintensity at this
time (series 11, image 21). But there is a trace focus of
susceptibility there on series 2, image 77. No corresponding CT
abnormality earlier today. Following contrast, no abnormal
enhancement.

No other convincing diffusion abnormality.

No midline shift, mass effect, evidence of mass lesion,
ventriculomegaly, extra-axial collection or acute intracranial
hemorrhage. Cervicomedullary junction and pituitary are within
normal limits. Normal cerebral volume.

Aside from the abnormal DWI, gray and white matter signal is within
normal limits throughout the brain. Only minimal nonspecific white
matter T2 and FLAIR hyperintensity (posterior left corona radiata
series 11, image 18). No chronic cortical encephalomalacia. No
abnormal enhancement identified. No dural thickening identified.

Vascular: Major dural venous sinuses seem to be enhancing and
patent. See dedicated MRV findings below. Major intracranial
vascular flow voids are preserved.

Skull and upper cervical spine: Negative visible cervical spine.
Visualized bone marrow signal is within normal limits.

Sinuses/Orbits: Negative.  Paranasal sinuses and mastoids are clear.

Other: Visible internal auditory structures appear normal.

MR VENOGRAM HEAD WITHOUT AND WITH CONTRAST

Pre contrast 2D and 3D intracranial MRV. Preserved flow signal in
the superior sagittal sinus, torcula, straight sinus, vein of ARK,
internal cerebral veins, bilateral transverse sinuses, sigmoid
sinuses and IJ bulbs (right transverse, sigmoid, and IJ appear
dominant).

Postcontrast images demonstrate expected enhancement of those dural
venous sinuses, also the cavernous sinus, and major draining
cortical veins.
IMPRESSION: 1. Solitary small 10 mm cortical area of abnormal diffusion and SWI
in the right middle frontal gyrus. This is most suggestive of acute
cortical ischemia, but there is no associated T2/FLAIR signal
abnormality. No abnormal enhancement or dural thickening.

2. Otherwise normal MRI appearance of the brain. No evidence of
white matter demyelinating disease.

3. Normal intracranial MRV. No evidence of dural venous sinus
thrombosis.

## 2021-04-07 IMAGING — MR MR MRV HEAD WO/W CM
7 series · 48 of 48 positions shown · IV contrast (gadavist)
Comparison: Head CT [IP] hours today.

CLINICAL DATA: 35-year-old female with headache and neurologic
deficit. Query demyelinating disease or venous sinus thrombosis.

EXAM:
MRI HEAD WITHOUT AND WITH CONTRAST
MR VENOGRAM HEAD WITHOUT AND WITH CONTRAST
TECHNIQUE: Multiplanar, multi-echo pulse sequences of the brain and surrounding
structures were acquired without and with intravenous contrast.
Angiographic images of the intracranial venous structures were
acquired using MRV technique without and with intravenous contrast.
CONTRAST:  10mL GADAVIST GADOBUTROL 1 MMOL/ML IV SOLN

[Series 5: DWI · axial · 3.0mm · 0.92mm/px · z∈[-75,+89]mm · 15 of 112 slices shown (1 of 4)]
[im 1/112]
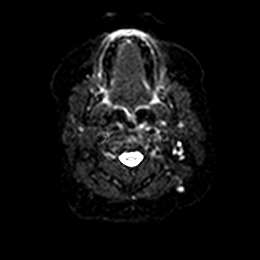
[im 8/112]
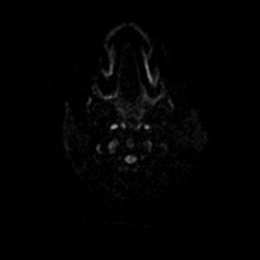
[im 16/112]
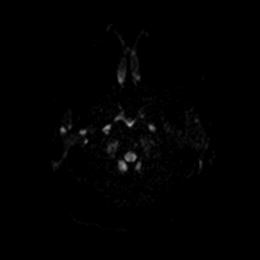
[im 24/112]
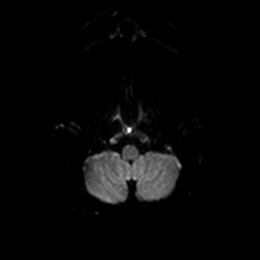
[im 32/112]
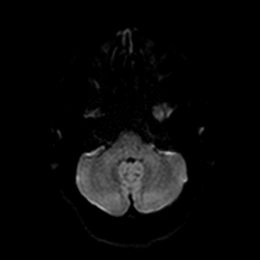
[im 40/112]
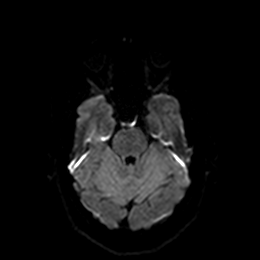
[im 48/112]
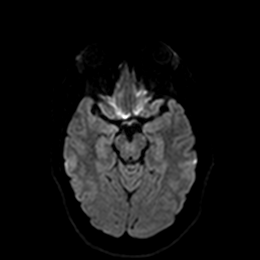
[im 56/112]
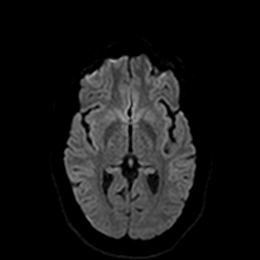
[im 64/112]
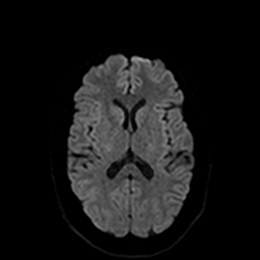
[im 72/112]
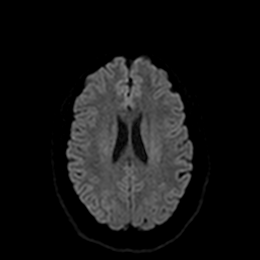
[im 80/112]
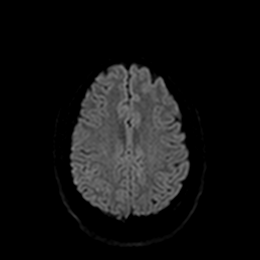
[im 88/112]
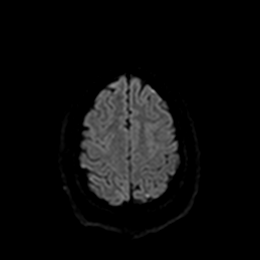
[im 96/112]
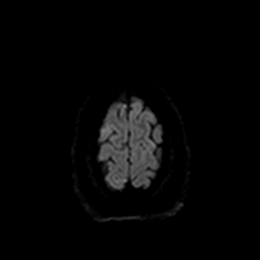
[im 104/112]
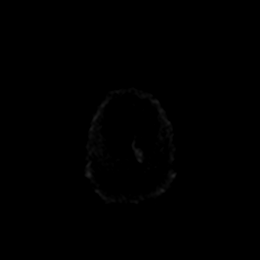
[im 112/112]
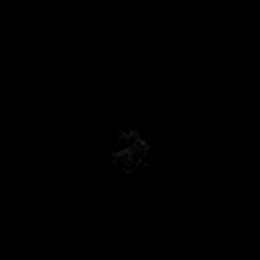

[Series 6: DWI · axial · 3.0mm · 0.92mm/px · z∈[-75,+89]mm · 7 of 56 slices shown (2 of 4)]
[im 1/56]
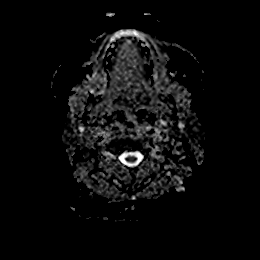
[im 10/56]
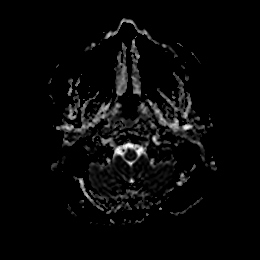
[im 19/56]
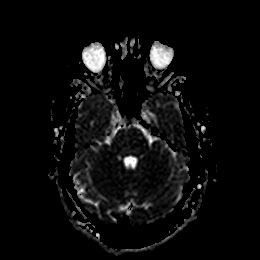
[im 28/56]
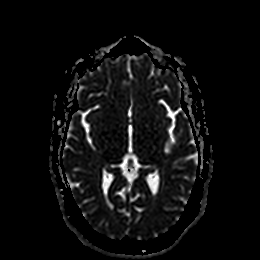
[im 37/56]
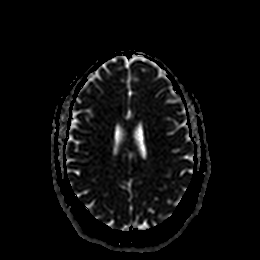
[im 46/56]
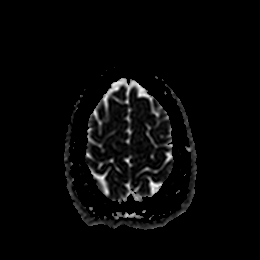
[im 56/56]
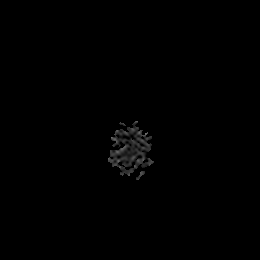

[Series 7: DWI · coronal · 4.0mm · 0.88mm/px · 10 of 82 slices shown (3 of 4)]
[im 1/82]
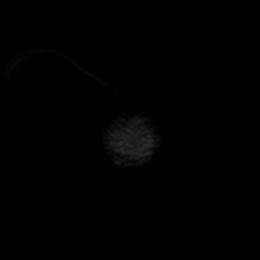
[im 10/82]
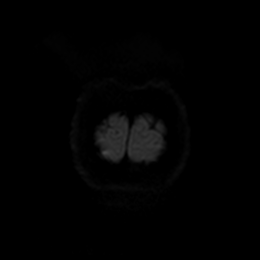
[im 19/82]
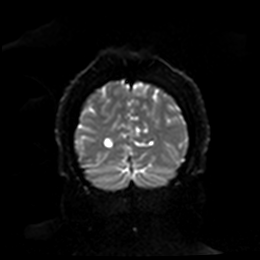
[im 28/82]
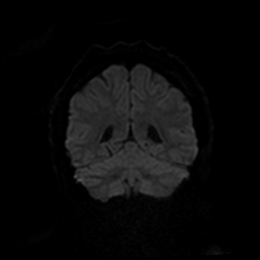
[im 37/82]
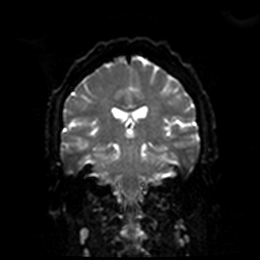
[im 46/82]
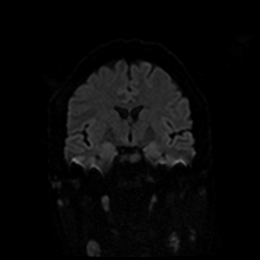
[im 55/82]
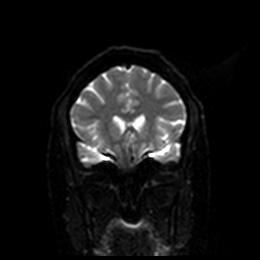
[im 64/82]
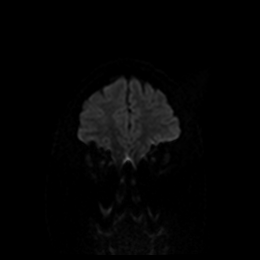
[im 73/82]
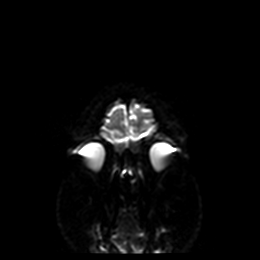
[im 82/82]
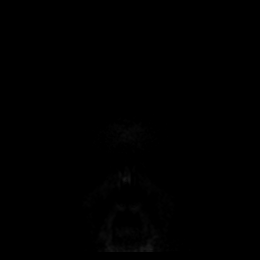

[Series 8: DWI · coronal · 4.0mm · 0.88mm/px · 5 of 41 slices shown (4 of 4)]
[im 1/41]
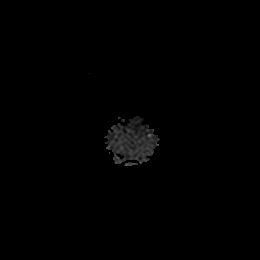
[im 11/41]
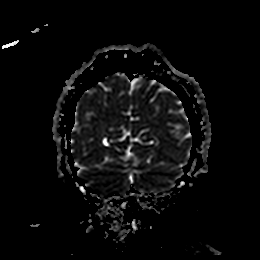
[im 21/41]
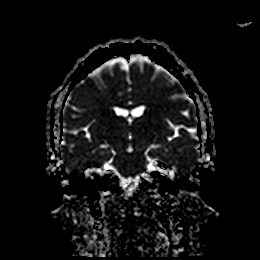
[im 31/41]
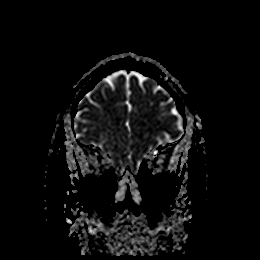
[im 41/41]
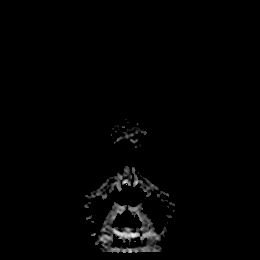

[Series 9: T1 · sagittal · 5.0mm · 0.78mm/px · 3 of 27 slices shown]
[im 1/27]
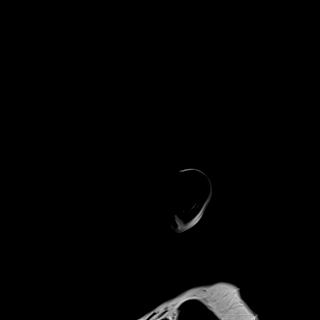
[im 14/27]
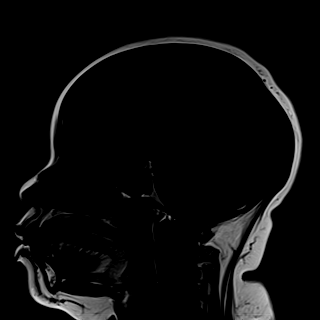
[im 27/27]
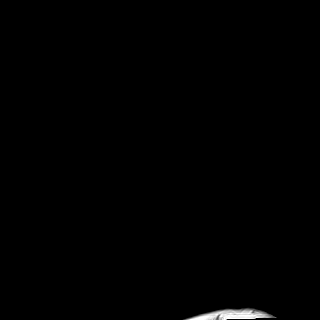

[Series 10: T2 · axial · 5.0mm · 0.75mm/px · z∈[-73,+94]mm · 4 of 29 slices shown]
[im 1/29]
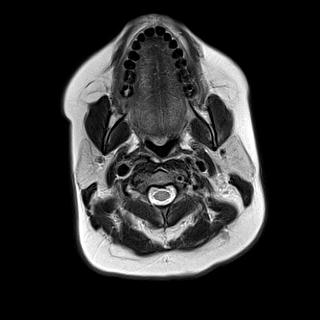
[im 10/29]
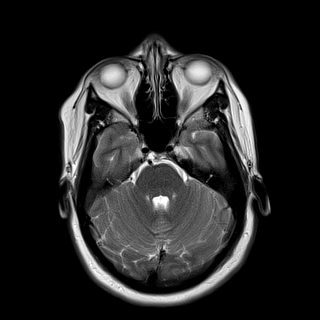
[im 19/29]
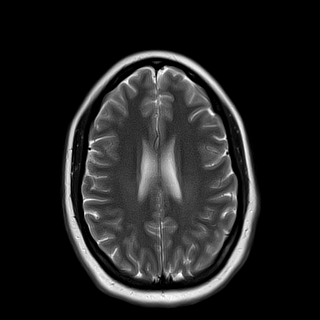
[im 29/29]
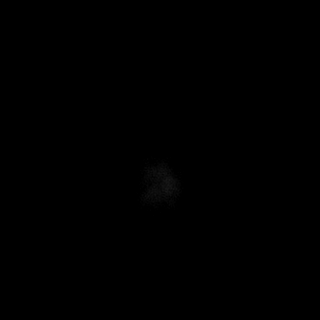

[Series 11: FLAIR · axial · 5.0mm · 0.47mm/px · z∈[-76,+92]mm · 4 of 29 slices shown]
[im 1/29]
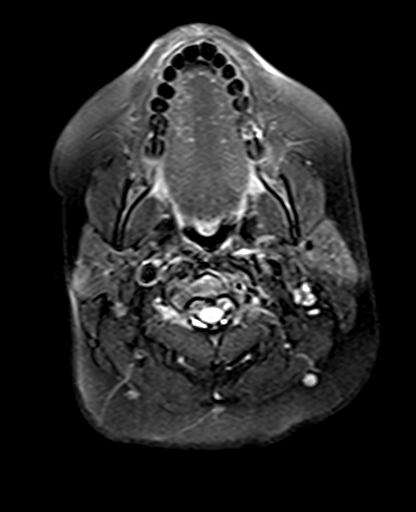
[im 10/29]
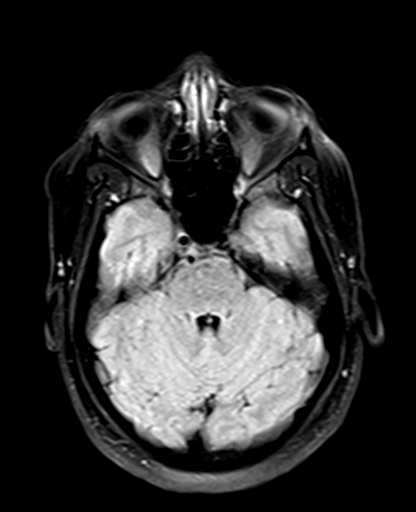
[im 19/29]
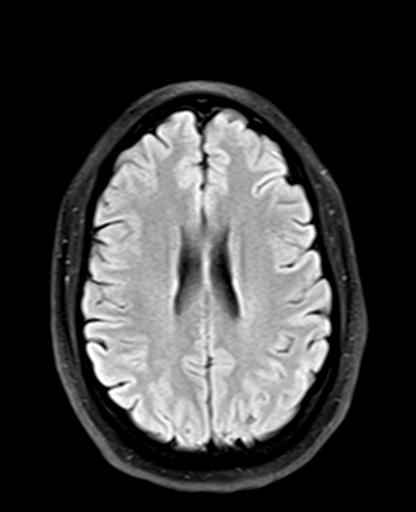
[im 29/29]
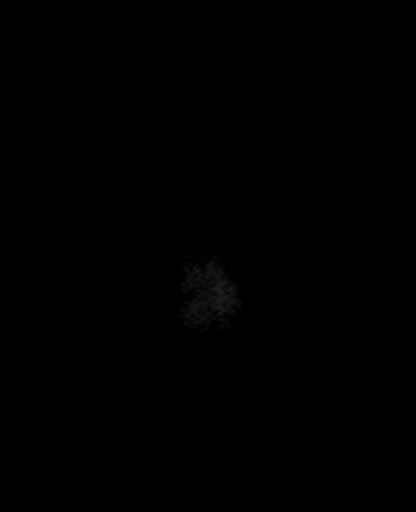

[48 of 48 positions shown; findings below may reference images not displayed]

FINDINGS: MRI HEAD WITHOUT AND WITH CONTRAST

Brain: Small focus of cortical restricted diffusion (approximately
10 mm series 5, image 98) in the right middle frontal gyrus (series
7, image 65), with no convincing T2 or FLAIR hyperintensity at this
time (series 11, image 21). But there is a trace focus of
susceptibility there on series 2, image 77. No corresponding CT
abnormality earlier today. Following contrast, no abnormal
enhancement.

No other convincing diffusion abnormality.

No midline shift, mass effect, evidence of mass lesion,
ventriculomegaly, extra-axial collection or acute intracranial
hemorrhage. Cervicomedullary junction and pituitary are within
normal limits. Normal cerebral volume.

Aside from the abnormal DWI, gray and white matter signal is within
normal limits throughout the brain. Only minimal nonspecific white
matter T2 and FLAIR hyperintensity (posterior left corona radiata
series 11, image 18). No chronic cortical encephalomalacia. No
abnormal enhancement identified. No dural thickening identified.

Vascular: Major dural venous sinuses seem to be enhancing and
patent. See dedicated MRV findings below. Major intracranial
vascular flow voids are preserved.

Skull and upper cervical spine: Negative visible cervical spine.
Visualized bone marrow signal is within normal limits.

Sinuses/Orbits: Negative.  Paranasal sinuses and mastoids are clear.

Other: Visible internal auditory structures appear normal.

MR VENOGRAM HEAD WITHOUT AND WITH CONTRAST

Pre contrast 2D and 3D intracranial MRV. Preserved flow signal in
the superior sagittal sinus, torcula, straight sinus, vein of ARK,
internal cerebral veins, bilateral transverse sinuses, sigmoid
sinuses and IJ bulbs (right transverse, sigmoid, and IJ appear
dominant).

Postcontrast images demonstrate expected enhancement of those dural
venous sinuses, also the cavernous sinus, and major draining
cortical veins.
IMPRESSION: 1. Solitary small 10 mm cortical area of abnormal diffusion and SWI
in the right middle frontal gyrus. This is most suggestive of acute
cortical ischemia, but there is no associated T2/FLAIR signal
abnormality. No abnormal enhancement or dural thickening.

2. Otherwise normal MRI appearance of the brain. No evidence of
white matter demyelinating disease.

3. Normal intracranial MRV. No evidence of dural venous sinus
thrombosis.

## 2021-04-07 IMAGING — CT CT HEAD W/O CM
4 series · 17 of 47 positions shown, 19 images · non-contrast
Comparison: None.

CLINICAL DATA: Neuro deficit, stroke suspected



[Series 2: head wo · axial · 0.41mm/px · z∈[-159,-39]mm · 7 of 32 slices shown, 9 images]
[im 4/32  brain]
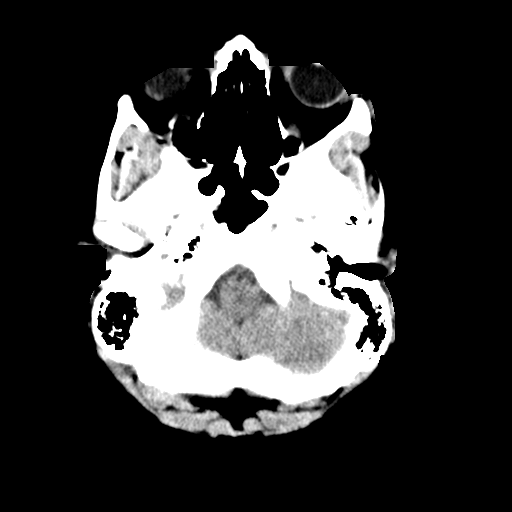
[im 4/32  bone]
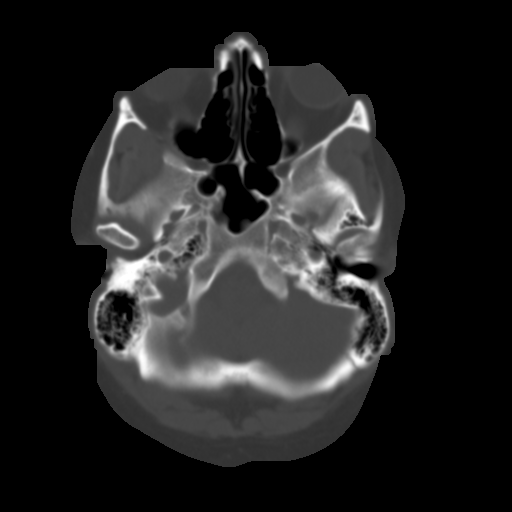
[im 8/32  brain]
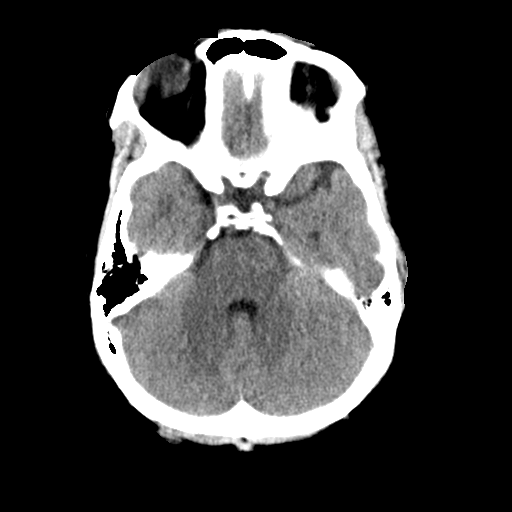
[im 12/32  brain]
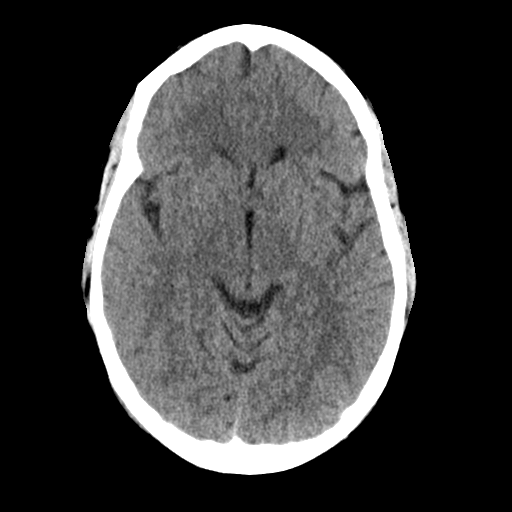
[im 16/32  brain]
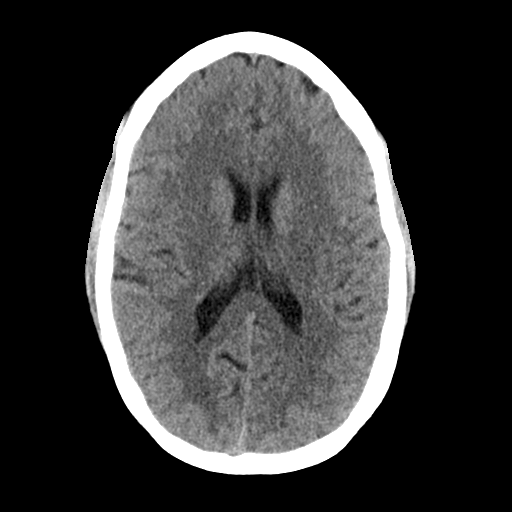
[im 20/32  brain]
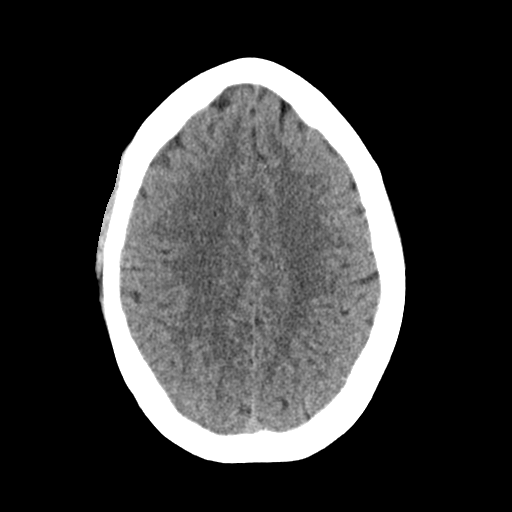
[im 20/32  bone]
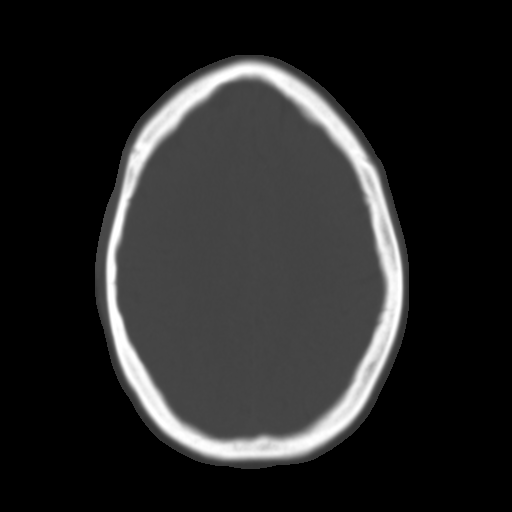
[im 24/32  brain]
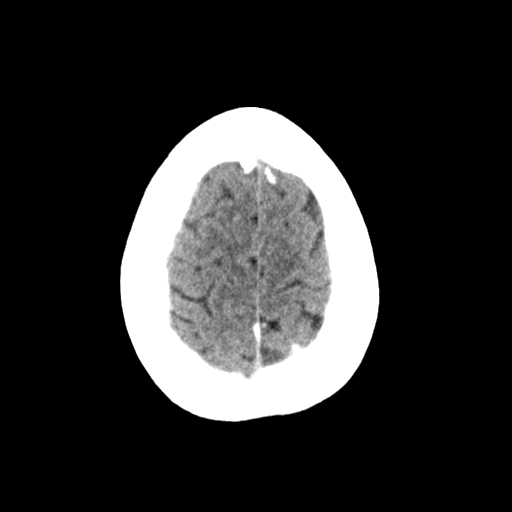
[im 28/32  brain]
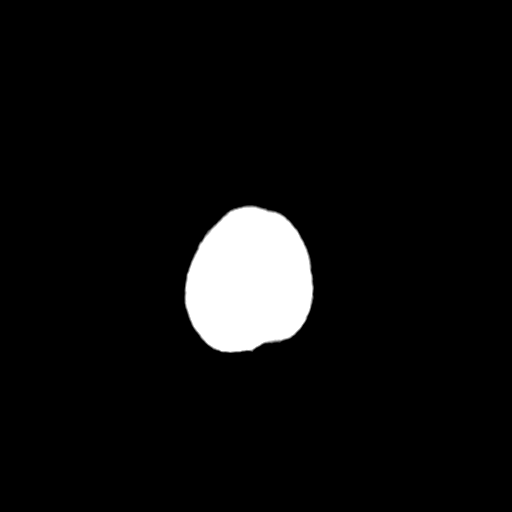

[Series 3: head bone · axial · 0.41mm/px · z∈[-160,-106]mm · 4 of 78 slices shown]
[im 8/78  bone]
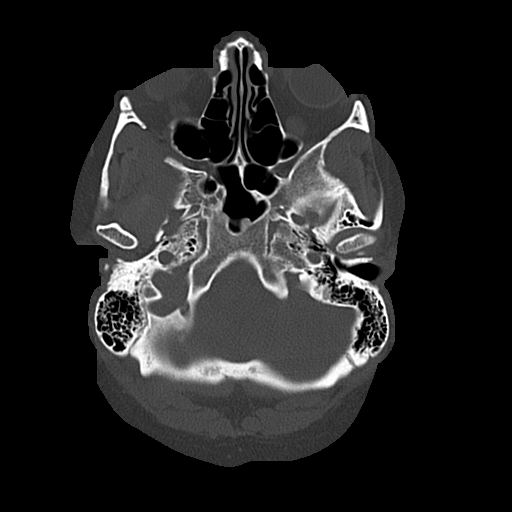
[im 16/78  bone]
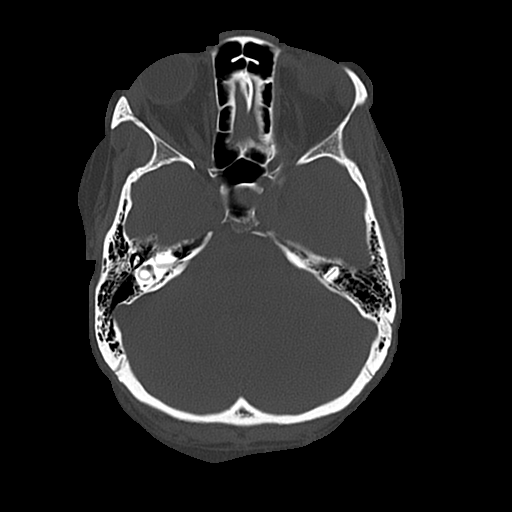
[im 24/78  bone]
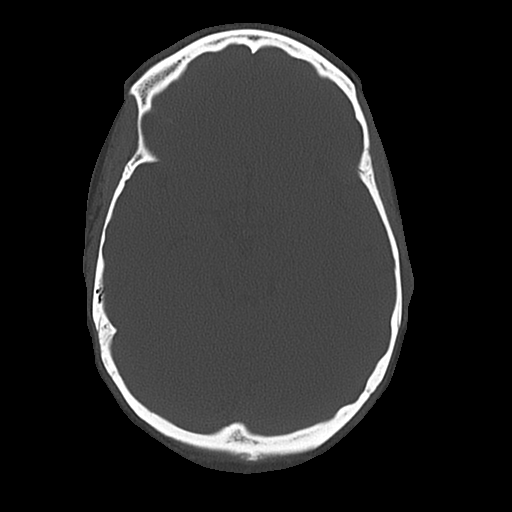
[im 35/78  bone]
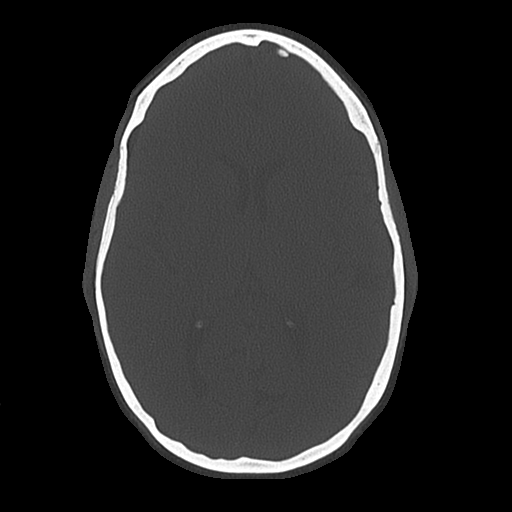

[Series 4: coronal soft · coronal · 0.31mm/px · 3 of 69 slices shown]
[im 23/69  brain]
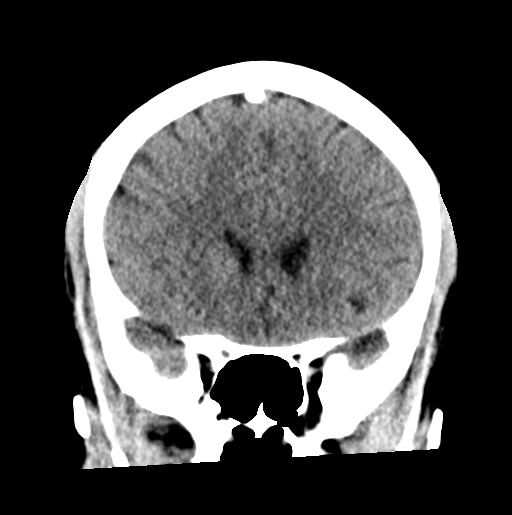
[im 31/69  brain]
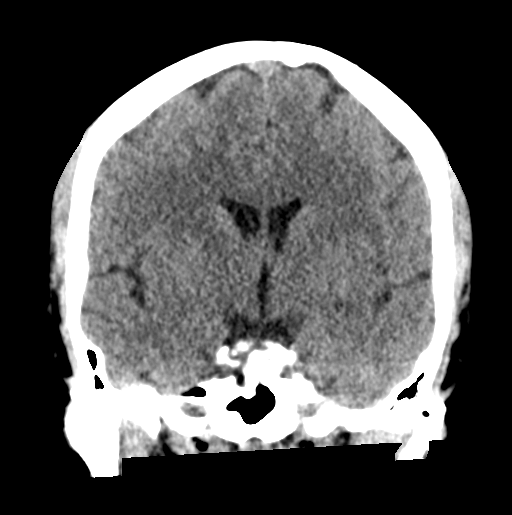
[im 38/69  brain]
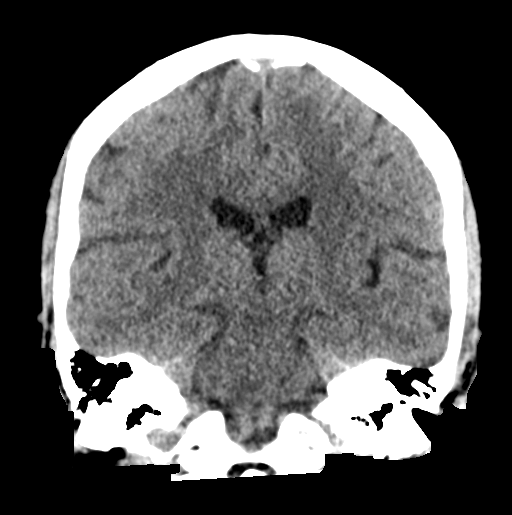

[Series 5: sagittal soft · sagittal · 0.31mm/px · 3 of 54 slices shown]
[im 18/54  brain]
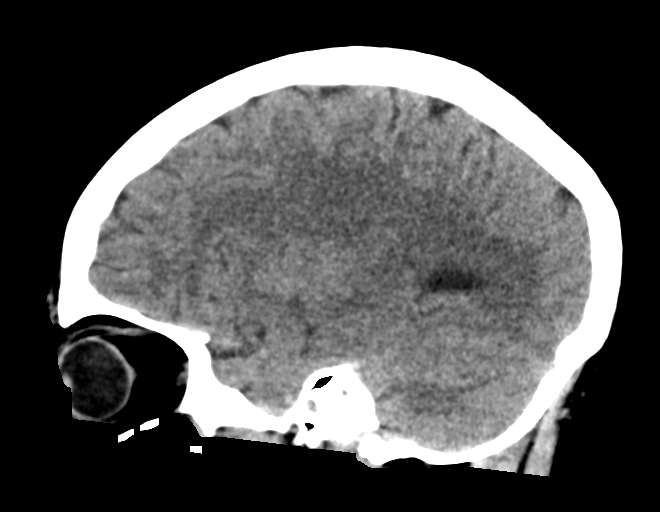
[im 27/54  brain]
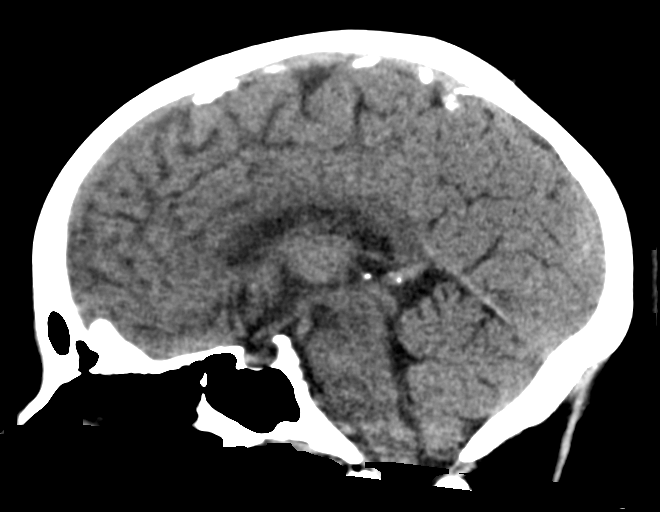
[im 36/54  brain]
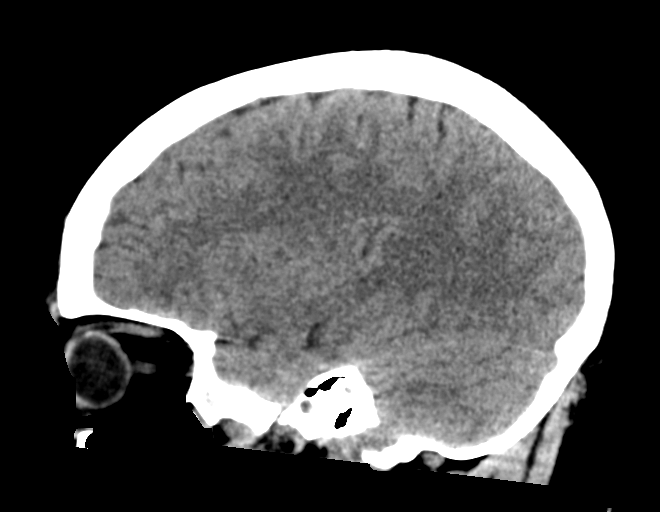

[17 of 47 positions shown; findings below may reference images not displayed]

FINDINGS: Brain: No evidence of acute infarction, hemorrhage, cerebral edema,
mass, mass effect, or midline shift. No hydrocephalus or extra-axial
fluid collection.

Vascular: No hyperdense vessel.

Skull: Normal. Negative for fracture or focal lesion.

Sinuses/Orbits: No acute finding.

Other: The mastoid air cells are well aerated.
IMPRESSION: IMPRESSION
No acute intracranial process. No evidence of acute infarct or
hemorrhage.

## 2021-04-07 IMAGING — MR MR MRV HEAD WO/W CM
5 of 10 series · 12 of 48 positions shown · IV contrast (Yes GAD)
Comparison: Head CT [IP] hours today.

CLINICAL DATA: 35-year-old female with headache and neurologic
deficit. Query demyelinating disease or venous sinus thrombosis.

EXAM:
MRI HEAD WITHOUT AND WITH CONTRAST
MR VENOGRAM HEAD WITHOUT AND WITH CONTRAST
TECHNIQUE: Multiplanar, multi-echo pulse sequences of the brain and surrounding
structures were acquired without and with intravenous contrast.
Angiographic images of the intracranial venous structures were
acquired using MRV technique without and with intravenous contrast.
CONTRAST:  10mL GADAVIST GADOBUTROL 1 MMOL/ML IV SOLN

[Series 2: (person_name) · axial · 3.0mm · 0.47mm/px · 1 of 108 slices shown]
[im 1/108]
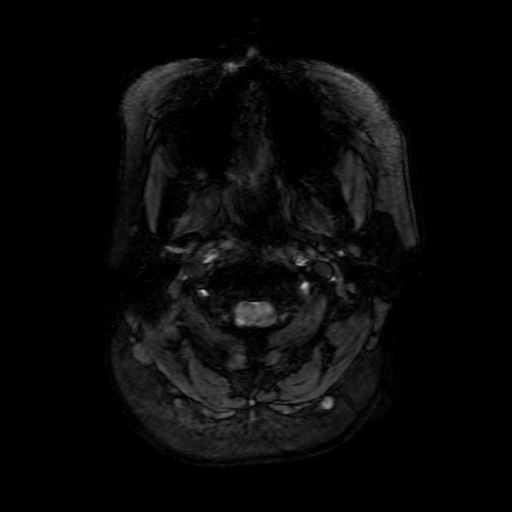

[Series 4: FLAIR · sagittal · 5.0mm · 0.23mm/px · 1 of 28 slices shown (1 of 2)]
[im 1/28]
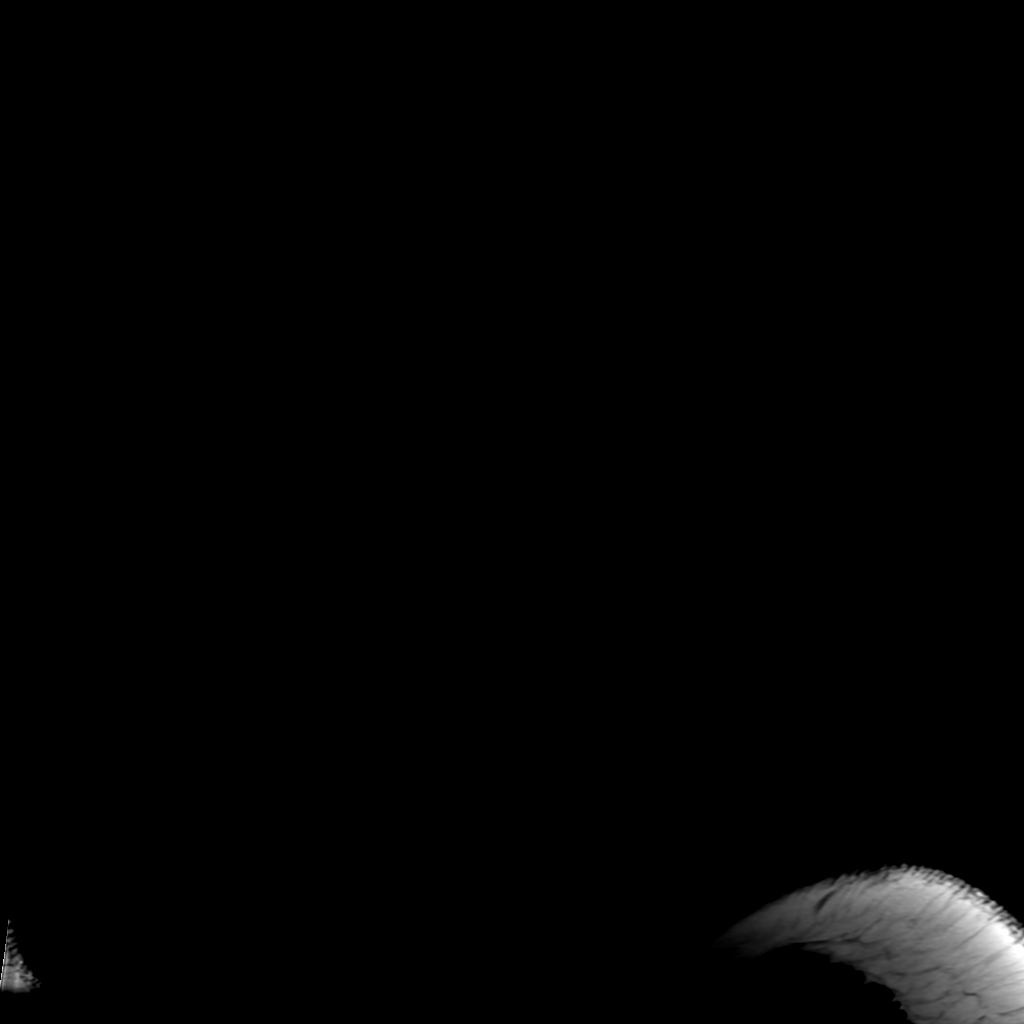

[Series 5: FLAIR · sagittal · 1.6mm · 0.47mm/px · 8 of 240 slices shown (2 of 2)]
[im 1/240]
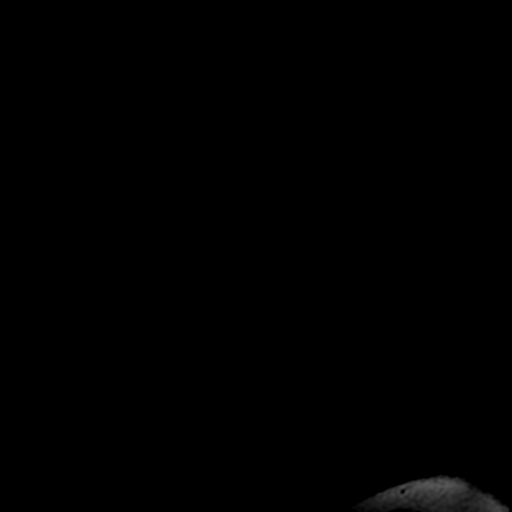
[im 35/240]
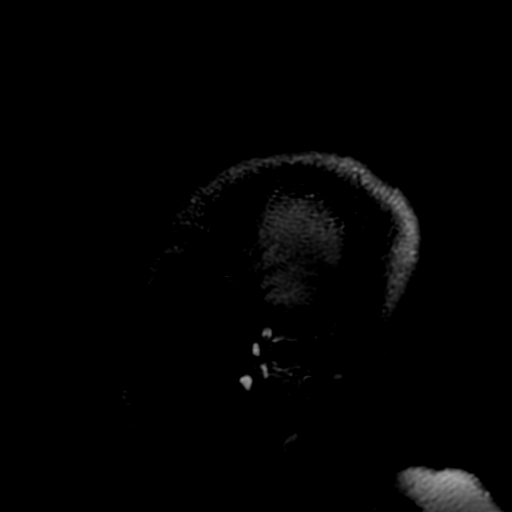
[im 69/240]
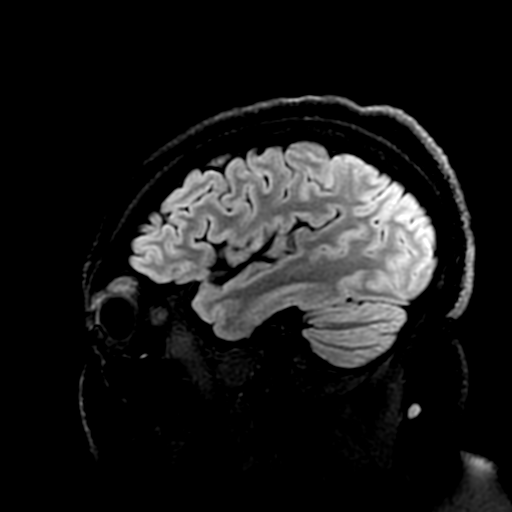
[im 103/240]
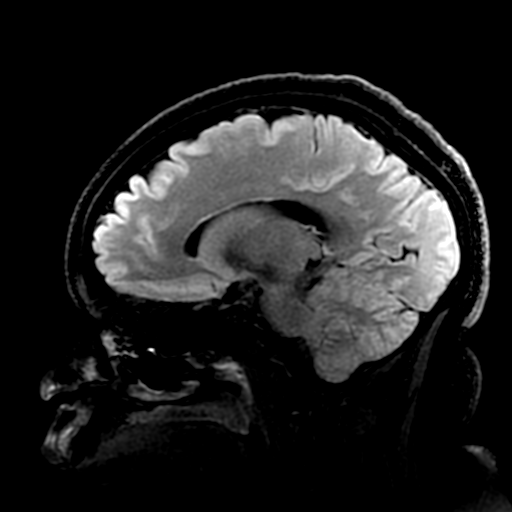
[im 137/240]
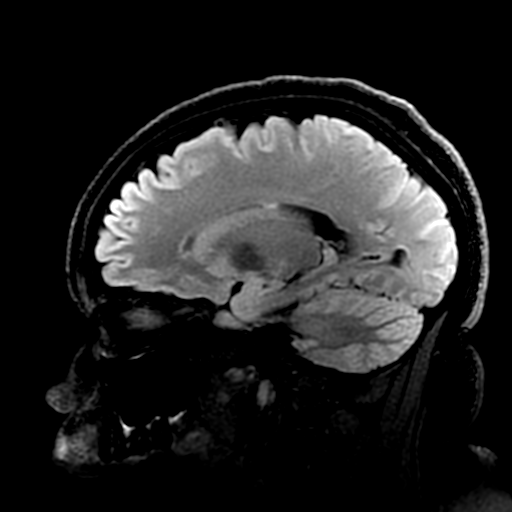
[im 171/240]
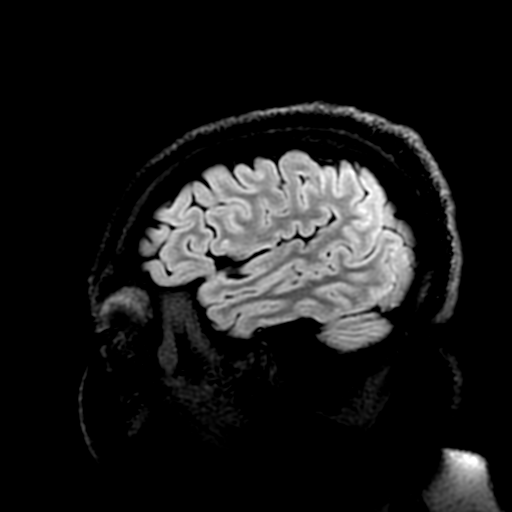
[im 205/240]
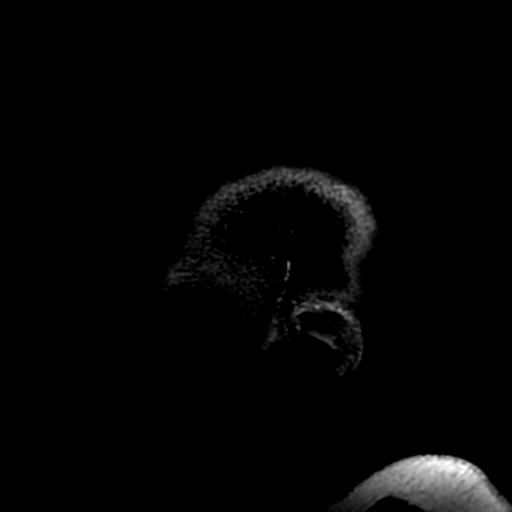
[im 240/240]
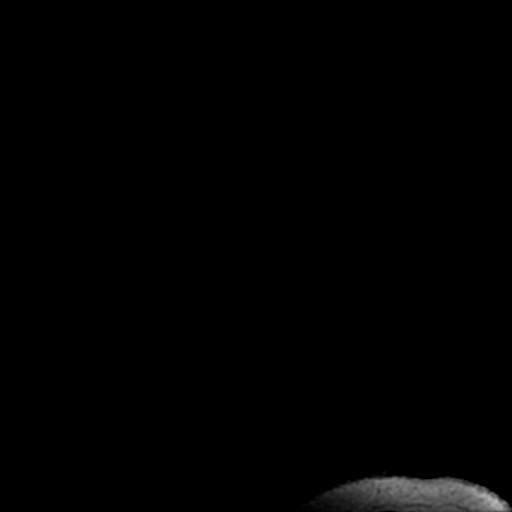

[Series 8: T2 post-contrast · coronal · 5.0mm · 0.20mm/px · 1 of 34 slices shown]
[im 1/34]
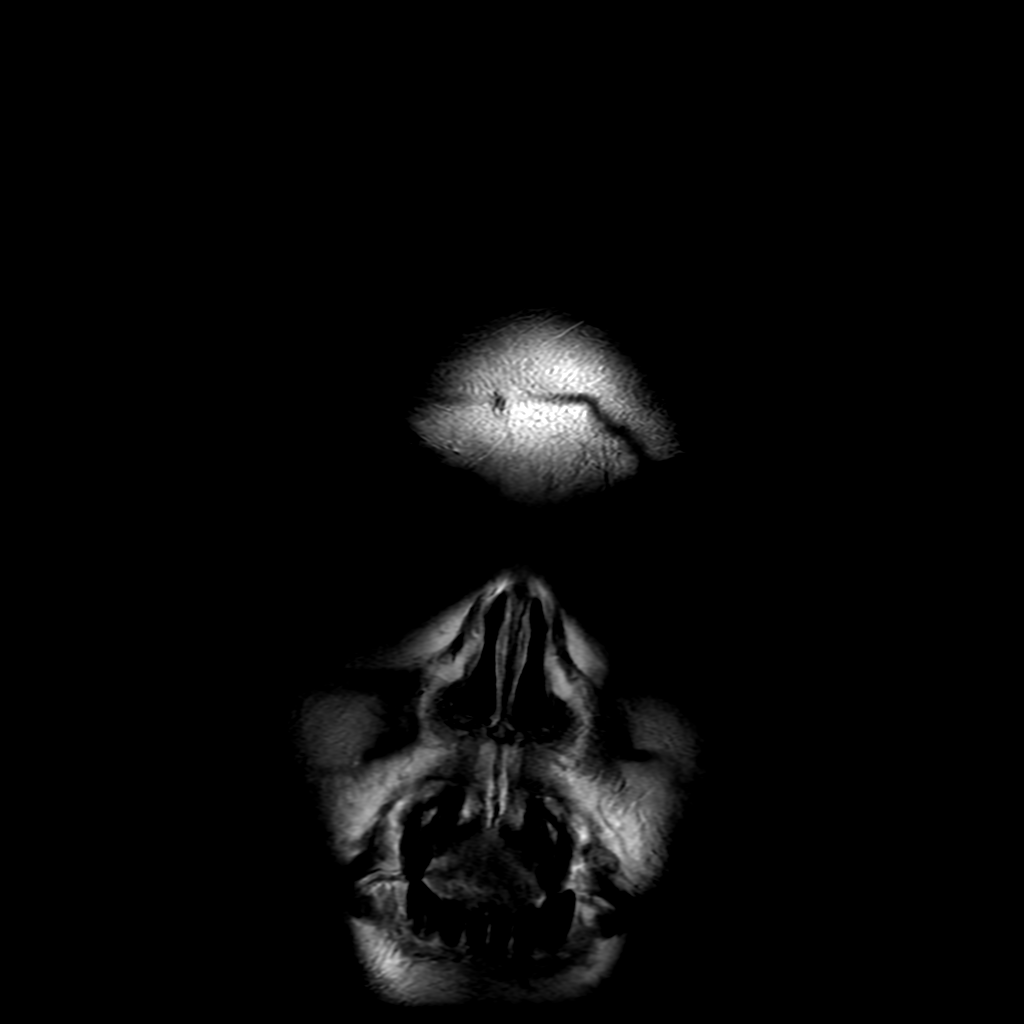

[Series 11: T1 · coronal · 5.0mm · 0.43mm/px · 1 of 34 slices shown]
[im 1/34]
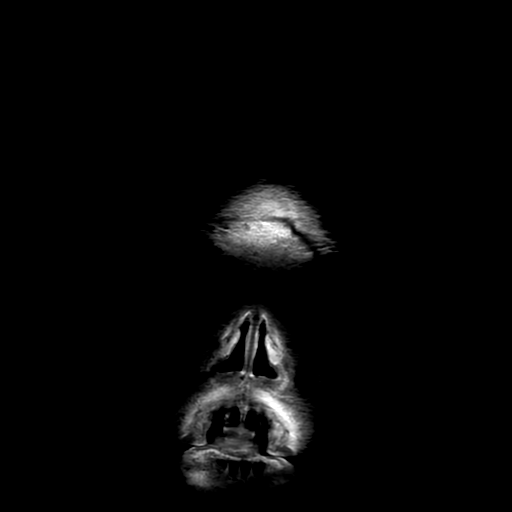

[12 of 48 positions shown; findings below may reference images not displayed]

FINDINGS: MRI HEAD WITHOUT AND WITH CONTRAST

Brain: Small focus of cortical restricted diffusion (approximately
10 mm series 5, image 98) in the right middle frontal gyrus (series
7, image 65), with no convincing T2 or FLAIR hyperintensity at this
time (series 11, image 21). But there is a trace focus of
susceptibility there on series 2, image 77. No corresponding CT
abnormality earlier today. Following contrast, no abnormal
enhancement.

No other convincing diffusion abnormality.

No midline shift, mass effect, evidence of mass lesion,
ventriculomegaly, extra-axial collection or acute intracranial
hemorrhage. Cervicomedullary junction and pituitary are within
normal limits. Normal cerebral volume.

Aside from the abnormal DWI, gray and white matter signal is within
normal limits throughout the brain. Only minimal nonspecific white
matter T2 and FLAIR hyperintensity (posterior left corona radiata
series 11, image 18). No chronic cortical encephalomalacia. No
abnormal enhancement identified. No dural thickening identified.

Vascular: Major dural venous sinuses seem to be enhancing and
patent. See dedicated MRV findings below. Major intracranial
vascular flow voids are preserved.

Skull and upper cervical spine: Negative visible cervical spine.
Visualized bone marrow signal is within normal limits.

Sinuses/Orbits: Negative.  Paranasal sinuses and mastoids are clear.

Other: Visible internal auditory structures appear normal.

MR VENOGRAM HEAD WITHOUT AND WITH CONTRAST

Pre contrast 2D and 3D intracranial MRV. Preserved flow signal in
the superior sagittal sinus, torcula, straight sinus, vein of ARK,
internal cerebral veins, bilateral transverse sinuses, sigmoid
sinuses and IJ bulbs (right transverse, sigmoid, and IJ appear
dominant).

Postcontrast images demonstrate expected enhancement of those dural
venous sinuses, also the cavernous sinus, and major draining
cortical veins.
IMPRESSION: 1. Solitary small 10 mm cortical area of abnormal diffusion and SWI
in the right middle frontal gyrus. This is most suggestive of acute
cortical ischemia, but there is no associated T2/FLAIR signal
abnormality. No abnormal enhancement or dural thickening.

2. Otherwise normal MRI appearance of the brain. No evidence of
white matter demyelinating disease.

3. Normal intracranial MRV. No evidence of dural venous sinus
thrombosis.

## 2021-04-07 IMAGING — CT CT ANGIO HEAD-NECK (W OR W/O PERF)
2 of 7 series · 8 of 33 positions shown · IV contrast (OMNI 350)
Comparison: Brain MRI/MRV and noncontrast CT head obtained earlier
the same day

CLINICAL DATA: Stroke/TIA, left-sided headache starting 129, left
cheek paresthesia and swallowing difficulties

EXAM:
CT ANGIOGRAPHY HEAD AND NECK
TECHNIQUE: Multidetector CT imaging of the head and neck was performed using
the standard protocol during bolus administration of intravenous
contrast. Multiplanar CT image reconstructions and MIPs were
obtained to evaluate the vascular anatomy. Carotid stenosis
measurements (when applicable) are obtained utilizing NASCET
criteria, using the distal internal carotid diameter as the
denominator.

[Series 5: cta neck · axial · 0.41mm/px · z∈[-212,-98]mm · 2 of 173 slices shown]
[im 58/173  soft-tissue]
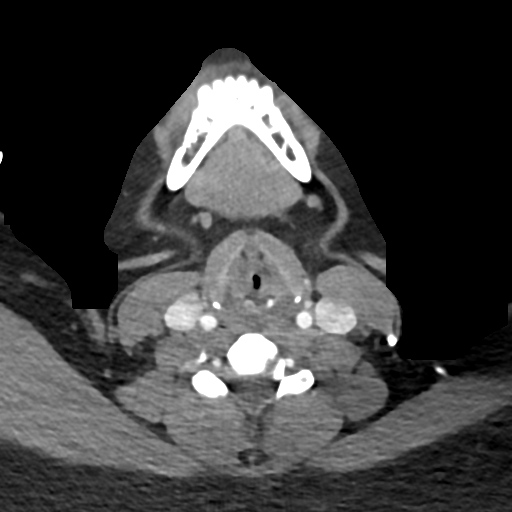
[im 115/173  soft-tissue]
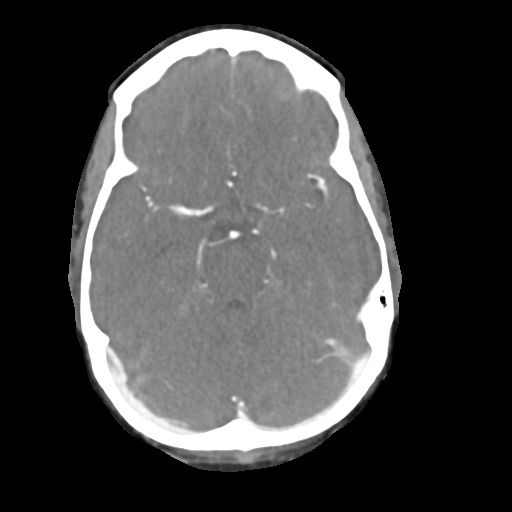

[Series 7: cta neck axial · axial · 0.39mm/px · z∈[-284,-41]mm · 6 of 342 slices shown]
[im 49/342  soft-tissue]
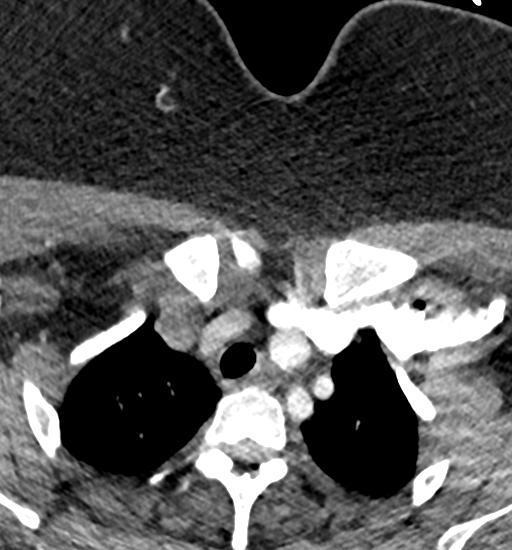
[im 98/342  bone]
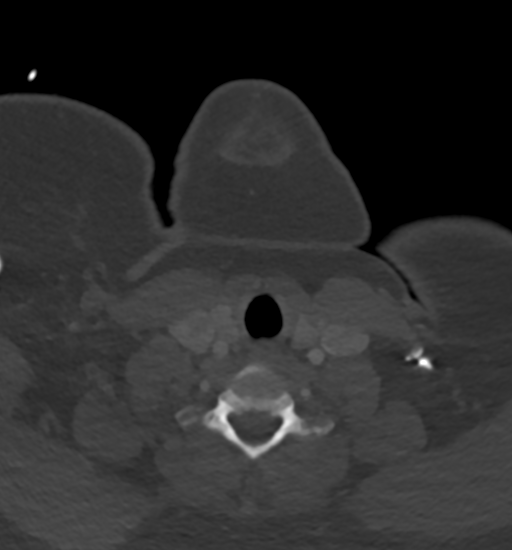
[im 147/342  soft-tissue]
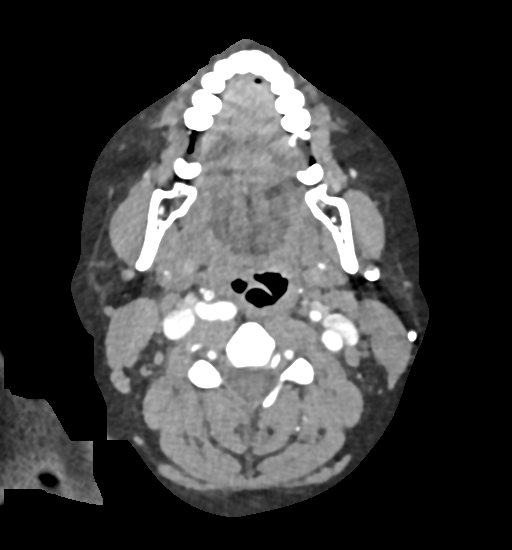
[im 195/342  bone]
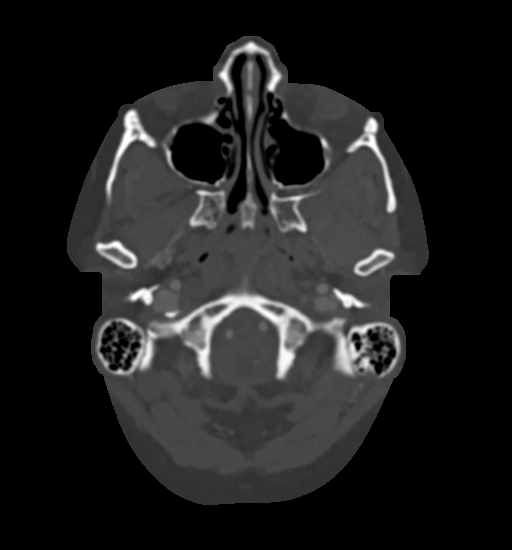
[im 244/342  soft-tissue]
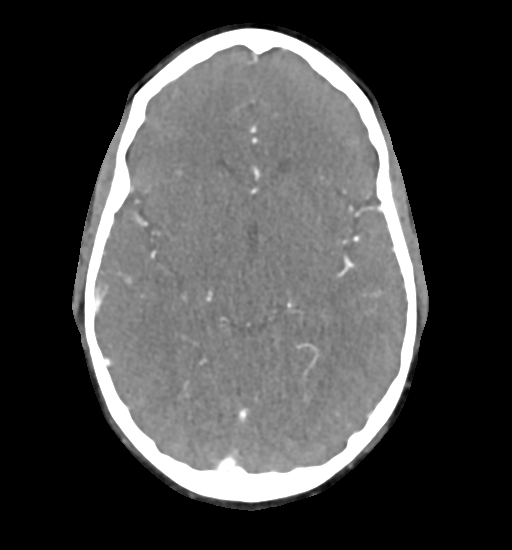
[im 293/342  bone]
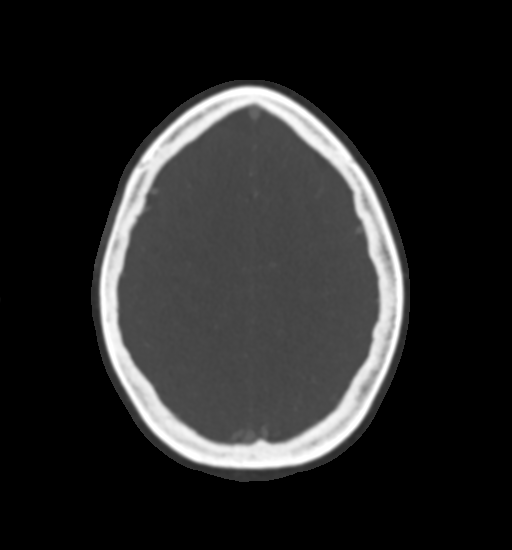

[8 of 33 positions shown; findings below may reference images not displayed]

RADIATION DOSE REDUCTION: This exam was performed according to the
departmental dose-optimization program which includes automated
exposure control, adjustment of the mA and/or kV according to
patient size and/or use of iterative reconstruction technique.

CONTRAST:  75mL OMNIPAQUE IOHEXOL 300 MG/ML  SOLN
FINDINGS: CTA NECK FINDINGS

Aortic arch: The aortic arch is normal. The origins of the major
branch vessels are patent. There is a common origin of the
brachiocephalic and left common carotid arteries, a normal variant.
The subclavian arteries are patent.

Right carotid system: The right common, internal, and external
carotid arteries are patent, without hemodynamically significant
stenosis or occlusion. There is no dissection or aneurysm. There is
a medialized retropharyngeal course of the right internal carotid
artery.

Left carotid system: The left common, internal, and external carotid
arteries are patent, without hemodynamically significant stenosis or
occlusion. There is no dissection or aneurysm.

Vertebral arteries: Vertebral arteries are patent, without
hemodynamically significant stenosis or occlusion. There is no
dissection or aneurysm.

Skeleton: There is no acute osseous abnormality or aggressive
osseous lesion. There is no visible canal hematoma.

Other neck: The soft tissues are unremarkable.

Upper chest: The imaged lung apices are clear.

Review of the MIP images confirms the above findings

CTA HEAD FINDINGS

Anterior circulation: The intracranial ICAs are patent.

The bilateral MCAs are patent.

The bilateral ACAs are patent. The anterior communicating artery is
normal.

There is no aneurysm or AVM.

Posterior circulation: The bilateral V4 segments are patent. The
PICA is identified bilaterally. The basilar artery is patent.

The bilateral PCAs are patent. The posterior communicating arteries
are not definitely seen.

There is no aneurysm or AVM.

Venous sinuses: Patent.

Anatomic variants: None.

Review of the MIP images confirms the above findings
IMPRESSION: Normal CTA of the head and neck.

## 2021-04-07 MED ORDER — ASPIRIN EC 81 MG PO TBEC
81.0000 mg | DELAYED_RELEASE_TABLET | Freq: Every day | ORAL | Status: DC
  Administered 2021-04-07 – 2021-04-09 (×3): 81 mg via ORAL
  Filled 2021-04-07 (×3): qty 1

## 2021-04-07 MED ORDER — SENNOSIDES-DOCUSATE SODIUM 8.6-50 MG PO TABS: 1.0000 | ORAL_TABLET | Freq: Every evening | ORAL | Status: DC | PRN

## 2021-04-07 MED ORDER — IOHEXOL 300 MG/ML  SOLN
100.0000 mL | Freq: Once | INTRAMUSCULAR | Status: AC | PRN
  Administered 2021-04-07: 75 mL via INTRAVENOUS

## 2021-04-07 MED ORDER — STROKE: EARLY STAGES OF RECOVERY BOOK
Freq: Once | Status: AC
  Filled 2021-04-07: qty 1

## 2021-04-07 MED ORDER — GADOBUTROL 1 MMOL/ML IV SOLN
10.0000 mL | Freq: Once | INTRAVENOUS | Status: AC | PRN
  Administered 2021-04-07: 10 mL via INTRAVENOUS

## 2021-04-07 MED ORDER — ACETAMINOPHEN 650 MG RE SUPP: 650.0000 mg | RECTAL | Status: DC | PRN

## 2021-04-07 MED ORDER — ACETAMINOPHEN 325 MG PO TABS
650.0000 mg | ORAL_TABLET | ORAL | Status: DC | PRN
  Administered 2021-04-08 (×2): 650 mg via ORAL
  Filled 2021-04-07 (×3): qty 2

## 2021-04-07 MED ORDER — ACETAMINOPHEN 160 MG/5ML PO SOLN: 650.0000 mg | ORAL | Status: DC | PRN

## 2021-04-07 MED ORDER — ENOXAPARIN SODIUM 40 MG/0.4ML IJ SOSY
40.0000 mg | PREFILLED_SYRINGE | INTRAMUSCULAR | Status: DC
  Administered 2021-04-07 – 2021-04-09 (×3): 40 mg via SUBCUTANEOUS
  Filled 2021-04-07 (×3): qty 0.4

## 2021-04-07 MED ORDER — SODIUM CHLORIDE 0.9 % IV SOLN: INTRAVENOUS | Status: DC

## 2021-04-07 MED ORDER — FERROUS SULFATE 325 (65 FE) MG PO TABS
325.0000 mg | ORAL_TABLET | Freq: Every day | ORAL | Status: DC
Start: 2021-04-08 — End: 2021-04-09
  Administered 2021-04-08 – 2021-04-09 (×2): 325 mg via ORAL
  Filled 2021-04-07 (×2): qty 1

## 2021-04-07 NOTE — Progress Notes (Signed)
°  Echocardiogram 2D Echocardiogram has been performed.  Sara Buckley 04/07/2021, 3:29 PM

## 2021-04-07 NOTE — ED Provider Notes (Signed)
[facility] Provider Note: Sara Dell, MD, FACEP  CSN: [id number] MRN: [id number] ARRIVAL: 04/06/21 at 2141 ROOM: [id number]/DB014   CHIEF COMPLAINT  Numbness   HISTORY OF PRESENT ILLNESS  04/07/21 2:00 AM [name] is a 36 y.o. female who awakened with a headache yesterday morning.  The headache was on the left side and is typical of the headache she gets when she "sleeps without a neck pillow).  The headache resolved without incident.  About 11 AM she noticed the left side of her face was prickly, almost an itching sensation.  She also noticed that she was having difficulty swallowing because she was drooling in the left side of her mouth would not work right.  She also had difficulty speaking.  Her mother describes her voice is sounding "sad".  She was also having difficulty thinking of certain words such as in trying to say "gum" she kept saying "gummy".  The symptoms only lasted less than 5 minutes and resolved on their own.  Nevertheless her mother states her voice sounded abnormal ("sad") for several more hours.  She never had any symptoms below the neck.  She has had some bilateral preauricular pain and some left postauricular pain.   History reviewed. No pertinent past medical history.  History reviewed. No pertinent surgical history.  No family history on file.  Social History   Tobacco Use   Smoking status: Never   Smokeless tobacco: Never  Vaping Use   Vaping Use: Never used  Substance Use Topics   Alcohol use: Not Currently   Drug use: Not Currently    Prior to Admission medications   Medication Sig Start Date End Date Taking? Authorizing Provider  EPINEPHrine 0.3 mg/0.3 mL IJ SOAJ injection Inject 0.3 mg into the muscle as needed for anaphylaxis. 07/27/20   Pricilla Loveless, MD    Allergies Patient has no known allergies.   REVIEW OF SYSTEMS  Negative except as noted here or in the History of Present Illness.   PHYSICAL EXAMINATION  Initial  Vital Signs Blood pressure (!) 156/129, pulse 96, temperature 97.8 F (36.6 C), temperature source Oral, resp. rate 12, height 5\' 8"  (1.727 m), weight (!) 143.3 kg, SpO2 97 %.  Examination General: Well-developed, well-nourished female in no acute distress; appearance consistent with age of record HENT: normocephalic; atraumatic Eyes: pupils equal, round and reactive to light; extraocular muscles intact Neck: supple Heart: regular rate and rhythm Lungs: clear to auscultation bilaterally Abdomen: soft; nondistended; nontender; no masses or hepatosplenomegaly; bowel sounds present Extremities: No deformity; full range of motion; pulses normal Neurologic: Awake, alert and oriented; motor function intact in all extremities and symmetric; sensation intact and symmetric in face; no facial droop; no pronator drift; normal finger-to-nose; summary: NIH 0 Skin: Warm and dry Psychiatric: Normal mood and affect   RESULTS  Summary of this visit's results, reviewed and interpreted by myself:   EKG Interpretation  Date/Time:  Wednesday April 07 2021 02:24:16 EST Ventricular Rate:  80 PR Interval:  186 QRS Duration: 81 QT Interval:  359 QTC Calculation: 415 R Axis:   24 Text Interpretation: Sinus rhythm Normal ECG No previous ECGs available Confirmed by Jakerria Kingbird, 09-25-1991 (Jonny Ruiz) on 04/07/2021 3:01:04 AM       Laboratory Studies: Results for orders placed or performed during the hospital encounter of 04/07/21 (from the past 24 hour(s))  hCG, serum, qualitative     Status: None   Collection Time: 04/07/21  2:34 AM  Result Value Ref Range  Preg, Serum NEGATIVE NEGATIVE  CBC with Differential/Platelet     Status: Abnormal   Collection Time: 04/07/21  2:34 AM  Result Value Ref Range   WBC 8.0 4.0 - 10.5 K/uL   RBC 4.15 3.87 - 5.11 MIL/uL   Hemoglobin 10.4 (L) 12.0 - 15.0 g/dL   HCT 16.134.0 (L) 09.636.0 - 04.546.0 %   MCV 81.9 80.0 - 100.0 fL   MCH 25.1 (L) 26.0 - 34.0 pg   MCHC 30.6 30.0 - 36.0 g/dL    RDW 40.914.7 81.111.5 - 91.415.5 %   Platelets 275 150 - 400 K/uL   nRBC 0.0 0.0 - 0.2 %   Neutrophils Relative % 56 %   Neutro Abs 4.5 1.7 - 7.7 K/uL   Lymphocytes Relative 38 %   Lymphs Abs 3.1 0.7 - 4.0 K/uL   Monocytes Relative 5 %   Monocytes Absolute 0.4 0.1 - 1.0 K/uL   Eosinophils Relative 1 %   Eosinophils Absolute 0.0 0.0 - 0.5 K/uL   Basophils Relative 0 %   Basophils Absolute 0.0 0.0 - 0.1 K/uL   Immature Granulocytes 0 %   Abs Immature Granulocytes 0.02 0.00 - 0.07 K/uL  Basic metabolic panel     Status: Abnormal   Collection Time: 04/07/21  3:48 AM  Result Value Ref Range   Sodium 139 135 - 145 mmol/L   Potassium 3.4 (L) 3.5 - 5.1 mmol/L   Chloride 104 98 - 111 mmol/L   CO2 25 22 - 32 mmol/L   Glucose, Bld 90 70 - 99 mg/dL   BUN 14 6 - 20 mg/dL   Creatinine, Ser 7.820.81 0.44 - 1.00 mg/dL   Calcium 8.6 (L) 8.9 - 10.3 mg/dL   GFR, Estimated >95>60 >62>60 mL/min   Anion gap 10 5 - 15  Resp Panel by RT-PCR (Flu A&B, Covid) Nasopharyngeal Swab     Status: None   Collection Time: 04/07/21  3:50 AM   Specimen: Nasopharyngeal Swab; Nasopharyngeal(NP) swabs in vial transport medium  Result Value Ref Range   SARS Coronavirus 2 by RT PCR NEGATIVE NEGATIVE   Influenza A by PCR NEGATIVE NEGATIVE   Influenza B by PCR NEGATIVE NEGATIVE   Imaging Studies: CT Head Wo Contrast  Result Date: 04/07/2021 CLINICAL DATA:  Neuro deficit, stroke suspected EXAM: CT HEAD WITHOUT CONTRAST TECHNIQUE: Contiguous axial images were obtained from the base of the skull through the vertex without intravenous contrast. RADIATION DOSE REDUCTION: This exam was performed according to the departmental dose-optimization program which includes automated exposure control, adjustment of the mA and/or kV according to patient size and/or use of iterative reconstruction technique. COMPARISON:  None. FINDINGS: Brain: No evidence of acute infarction, hemorrhage, cerebral edema, mass, mass effect, or midline shift. No hydrocephalus  or extra-axial fluid collection. Vascular: No hyperdense vessel. Skull: Normal. Negative for fracture or focal lesion. Sinuses/Orbits: No acute finding. Other: The mastoid air cells are well aerated. IMPRESSION: IMPRESSION No acute intracranial process. No evidence of acute infarct or hemorrhage. Electronically Signed   By: Wiliam KeAlison  Vasan M.D.   On: 04/07/2021 02:56    ED COURSE and MDM  Nursing notes, initial and subsequent vitals signs, including pulse oximetry, reviewed and interpreted by myself.  Vitals:   04/06/21 2155 04/06/21 2156 04/07/21 0200 04/07/21 0322  BP:  (!) 156/129 (!) 147/97 (!) 150/90  Pulse:  96 80 66  Resp:  12 19 17   Temp:  97.8 F (36.6 C)    TempSrc:  Oral  SpO2:  97% 100% 100%  Weight: (!) 143.3 kg     Height: 5\' 8"  (1.727 m)      Medications - No data to display  3:31 AM Discussed with Dr. of neurology.  He advises we transfer the patient to the [facility] ED for an MRI of the brain with and without contrast to evaluate for demyelinating disease or venous sinus thrombosis.  Dr. Redge Gainer is the accepting EDP.  PROCEDURES  Procedures   ED DIAGNOSES     ICD-10-CM   1. TIA (transient ischemic attack)  G45.9          Alanda Colton, MD 04/07/21 [phone]

## 2021-04-07 NOTE — ED Provider Notes (Signed)
Call to neuro, Dr. Thomasena Edis, abnormal MRI, requests patient moved to a room. Charge nurse aware.    Jeannie Fend, PA-C 04/07/21 0900    Glendora Score, MD 04/07/21 [phone]

## 2021-04-07 NOTE — Assessment & Plan Note (Signed)
-  Body mass index is 48.04 kg/m..  -Ongoing weight loss should be encouraged -Outpatient PCP/bariatric medicine/bariatric surgery f/u encouraged

## 2021-04-07 NOTE — ED Provider Notes (Signed)
[facility] EMERGENCY DEPARTMENT Provider Note   CSN: [id number] Arrival date & time: 04/06/21  2141     History Chief Complaint  Patient presents with   Numbness    Zuly Belkin is a 36 y.o. female. No known PMH.  History obtained by chart review.  Patient was originally evaluated at [facility]. She presented with a left sided headache that started on 1/29 that was present when she woke up. At 1100, she noticed some left cheek paresthesias and swallowing difficulties with extensive left throat drooling. She also had associated difficulty speaking, word finding difficulties. The symptoms resolved by 3 pm yesterday afternoon. This patient was sent over to [facility] ED for MRI to evaluate for demyelinating disease v venous sinus thrombosis.  On my history patient is currently asymptomatic with no other complaints.   HPI     Home Medications Prior to Admission medications   Medication Sig Start Date End Date Taking? Authorizing Provider  EPINEPHrine 0.3 mg/0.3 mL IJ SOAJ injection Inject 0.3 mg into the muscle as needed for anaphylaxis. 07/27/20   Pricilla Loveless, MD      Allergies    Patient has no known allergies.    Review of Systems   Review of Systems  HENT:  Positive for drooling and trouble swallowing.   Neurological:  Positive for speech difficulty, numbness and headaches.  All other systems reviewed and are negative.  Physical Exam Updated Vital Signs BP (!) 154/99 (BP Location: Right Arm)    Pulse 80    Temp 99 F (37.2 C) (Oral)    Resp 18    Ht 5\' 8"  (1.727 m)    Wt (!) 143.3 kg    SpO2 100%    BMI 48.04 kg/m  Physical Exam Vitals and nursing note reviewed.  Constitutional:      General: She is not in acute distress.    Appearance: Normal appearance. She is not ill-appearing, toxic-appearing or diaphoretic.  HENT:     Head: Normocephalic and atraumatic.     Nose: No nasal deformity.     Mouth/Throat:     Lips: Pink. No  lesions.     Mouth: Mucous membranes are moist. No injury, lacerations, oral lesions or angioedema.     Pharynx: Oropharynx is clear. Uvula midline. No pharyngeal swelling, oropharyngeal exudate, posterior oropharyngeal erythema or uvula swelling.  Eyes:     General: Gaze aligned appropriately. No scleral icterus.       Right eye: No discharge.        Left eye: No discharge.     Conjunctiva/sclera: Conjunctivae normal.     Right eye: Right conjunctiva is not injected. No exudate or hemorrhage.    Left eye: Left conjunctiva is not injected. No exudate or hemorrhage.    Pupils: Pupils are equal, round, and reactive to light.  Cardiovascular:     Rate and Rhythm: Normal rate and regular rhythm.     Pulses: Normal pulses.          Radial pulses are 2+ on the right side and 2+ on the left side.       Dorsalis pedis pulses are 2+ on the right side and 2+ on the left side.     Heart sounds: Normal heart sounds, S1 normal and S2 normal. Heart sounds not distant. No murmur heard.   No friction rub. No gallop. No S3 or S4 sounds.  Pulmonary:     Effort: Pulmonary effort is normal. No accessory  muscle usage or respiratory distress.     Breath sounds: Normal breath sounds. No stridor. No wheezing, rhonchi or rales.  Chest:     Chest wall: No tenderness.  Abdominal:     General: Abdomen is flat. Bowel sounds are normal. There is no distension.     Palpations: Abdomen is soft. There is no mass or pulsatile mass.     Tenderness: There is no abdominal tenderness. There is no guarding or rebound.  Musculoskeletal:     Right lower leg: No edema.     Left lower leg: No edema.  Skin:    General: Skin is warm and dry.     Coloration: Skin is not jaundiced or pale.     Findings: No bruising, erythema, lesion or rash.  Neurological:     General: No focal deficit present.     Mental Status: She is alert and oriented to person, place, and time.     GCS: GCS eye subscore is 4. GCS verbal subscore is 5.  GCS motor subscore is 6.     Cranial Nerves: No cranial nerve deficit.     Sensory: No sensory deficit.     Motor: No weakness.     Coordination: Coordination normal.     Gait: Gait normal.     Comments: Alert and Oriented x 3 Speech clear with no aphasia Cranial Nerve testing - PERRLA. EOM intact. No Nystagmus - Facial Sensation grossly intact - No facial asymmetry Motor: - 5/5 motor strength in all four extremities.  Sensation: - Grossly intact in all four extremities.  Coordination:  - Finger to nose and heel to shin intact bilaterally    Psychiatric:        Mood and Affect: Mood normal.        Behavior: Behavior normal. Behavior is cooperative.    ED Results / Procedures / Treatments   Labs (all labs ordered are listed, but only abnormal results are displayed) Labs Reviewed  CBC WITH DIFFERENTIAL/PLATELET - Abnormal; Notable for the following components:      Result Value   Hemoglobin 10.4 (*)    HCT 34.0 (*)    MCH 25.1 (*)    All other components within normal limits  BASIC METABOLIC PANEL - Abnormal; Notable for the following components:   Potassium 3.4 (*)    Calcium 8.6 (*)    All other components within normal limits  RESP PANEL BY RT-PCR (FLU A&B, COVID) ARPGX2  HCG, SERUM, QUALITATIVE  HEMOGLOBIN A1C  LIPID PANEL    EKG EKG Interpretation  Date/Time:  Wednesday April 07 2021 02:24:16 EST Ventricular Rate:  80 PR Interval:  186 QRS Duration: 81 QT Interval:  359 QTC Calculation: 415 R Axis:   24 Text Interpretation: Sinus rhythm Normal ECG No previous ECGs available Confirmed by Paula Libra ([id number]) on 04/07/2021 3:01:04 AM  Radiology CT Head Wo Contrast  Result Date: 04/07/2021 CLINICAL DATA:  Neuro deficit, stroke suspected EXAM: CT HEAD WITHOUT CONTRAST TECHNIQUE: Contiguous axial images were obtained from the base of the skull through the vertex without intravenous contrast. RADIATION DOSE REDUCTION: This exam was performed according to the  departmental dose-optimization program which includes automated exposure control, adjustment of the mA and/or kV according to patient size and/or use of iterative reconstruction technique. COMPARISON:  None. FINDINGS: Brain: No evidence of acute infarction, hemorrhage, cerebral edema, mass, mass effect, or midline shift. No hydrocephalus or extra-axial fluid collection. Vascular: No hyperdense vessel. Skull: Normal. Negative for fracture or focal  lesion. Sinuses/Orbits: No acute finding. Other: The mastoid air cells are well aerated. IMPRESSION: IMPRESSION No acute intracranial process. No evidence of acute infarct or hemorrhage. Electronically Signed   By: Wiliam Ke M.D.   On: 04/07/2021 02:56   MR Brain W and Wo Contrast  Result Date: 04/07/2021 CLINICAL DATA:  36 year old female with headache and neurologic deficit. Query demyelinating disease or venous sinus thrombosis. EXAM: MRI HEAD WITHOUT AND WITH CONTRAST MR VENOGRAM HEAD WITHOUT AND WITH CONTRAST TECHNIQUE: Multiplanar, multi-echo pulse sequences of the brain and surrounding structures were acquired without and with intravenous contrast. Angiographic images of the intracranial venous structures were acquired using MRV technique without and with intravenous contrast. CONTRAST:  59mL GADAVIST GADOBUTROL 1 MMOL/ML IV SOLN COMPARISON:  Head CT 0254 hours today. FINDINGS: MRI HEAD WITHOUT AND WITH CONTRAST Brain: Small focus of cortical restricted diffusion (approximately 10 mm series 5, image 98) in the right middle frontal gyrus (series 7, image 65), with no convincing T2 or FLAIR hyperintensity at this time (series 11, image 21). But there is a trace focus of susceptibility there on series 2, image 77. No corresponding CT abnormality earlier today. Following contrast, no abnormal enhancement. No other convincing diffusion abnormality. No midline shift, mass effect, evidence of mass lesion, ventriculomegaly, extra-axial collection or acute intracranial  hemorrhage. Cervicomedullary junction and pituitary are within normal limits. Normal cerebral volume. Aside from the abnormal DWI, gray and white matter signal is within normal limits throughout the brain. Only minimal nonspecific white matter T2 and FLAIR hyperintensity (posterior left corona radiata series 11, image 18). No chronic cortical encephalomalacia. No abnormal enhancement identified. No dural thickening identified. Vascular: Major dural venous sinuses seem to be enhancing and patent. See dedicated MRV findings below. Major intracranial vascular flow voids are preserved. Skull and upper cervical spine: Negative visible cervical spine. Visualized bone marrow signal is within normal limits. Sinuses/Orbits: Negative.  Paranasal sinuses and mastoids are clear. Other: Visible internal auditory structures appear normal. MR VENOGRAM HEAD WITHOUT AND WITH CONTRAST Pre contrast 2D and 3D intracranial MRV. Preserved flow signal in the superior sagittal sinus, torcula, straight sinus, vein of Galen, internal cerebral veins, bilateral transverse sinuses, sigmoid sinuses and IJ bulbs (right transverse, sigmoid, and IJ appear dominant). Postcontrast images demonstrate expected enhancement of those dural venous sinuses, also the cavernous sinus, and major draining cortical veins. IMPRESSION: 1. Solitary small 10 mm cortical area of abnormal diffusion and SWI in the right middle frontal gyrus. This is most suggestive of acute cortical ischemia, but there is no associated T2/FLAIR signal abnormality. No abnormal enhancement or dural thickening. 2. Otherwise normal MRI appearance of the brain. No evidence of white matter demyelinating disease. 3. Normal intracranial MRV. No evidence of dural venous sinus thrombosis. Electronically Signed   By: Odessa Fleming M.D.   On: 04/07/2021 08:06   MR MRV HEAD W WO CONTRAST  Result Date: 04/07/2021 CLINICAL DATA:  36 year old female with headache and neurologic deficit. Query  demyelinating disease or venous sinus thrombosis. EXAM: MRI HEAD WITHOUT AND WITH CONTRAST MR VENOGRAM HEAD WITHOUT AND WITH CONTRAST TECHNIQUE: Multiplanar, multi-echo pulse sequences of the brain and surrounding structures were acquired without and with intravenous contrast. Angiographic images of the intracranial venous structures were acquired using MRV technique without and with intravenous contrast. CONTRAST:  62mL GADAVIST GADOBUTROL 1 MMOL/ML IV SOLN COMPARISON:  Head CT 0254 hours today. FINDINGS: MRI HEAD WITHOUT AND WITH CONTRAST Brain: Small focus of cortical restricted diffusion (approximately 10 mm series 5, image  98) in the right middle frontal gyrus (series 7, image 65), with no convincing T2 or FLAIR hyperintensity at this time (series 11, image 21). But there is a trace focus of susceptibility there on series 2, image 77. No corresponding CT abnormality earlier today. Following contrast, no abnormal enhancement. No other convincing diffusion abnormality. No midline shift, mass effect, evidence of mass lesion, ventriculomegaly, extra-axial collection or acute intracranial hemorrhage. Cervicomedullary junction and pituitary are within normal limits. Normal cerebral volume. Aside from the abnormal DWI, gray and white matter signal is within normal limits throughout the brain. Only minimal nonspecific white matter T2 and FLAIR hyperintensity (posterior left corona radiata series 11, image 18). No chronic cortical encephalomalacia. No abnormal enhancement identified. No dural thickening identified. Vascular: Major dural venous sinuses seem to be enhancing and patent. See dedicated MRV findings below. Major intracranial vascular flow voids are preserved. Skull and upper cervical spine: Negative visible cervical spine. Visualized bone marrow signal is within normal limits. Sinuses/Orbits: Negative.  Paranasal sinuses and mastoids are clear. Other: Visible internal auditory structures appear normal. MR  VENOGRAM HEAD WITHOUT AND WITH CONTRAST Pre contrast 2D and 3D intracranial MRV. Preserved flow signal in the superior sagittal sinus, torcula, straight sinus, vein of Galen, internal cerebral veins, bilateral transverse sinuses, sigmoid sinuses and IJ bulbs (right transverse, sigmoid, and IJ appear dominant). Postcontrast images demonstrate expected enhancement of those dural venous sinuses, also the cavernous sinus, and major draining cortical veins. IMPRESSION: 1. Solitary small 10 mm cortical area of abnormal diffusion and SWI in the right middle frontal gyrus. This is most suggestive of acute cortical ischemia, but there is no associated T2/FLAIR signal abnormality. No abnormal enhancement or dural thickening. 2. Otherwise normal MRI appearance of the brain. No evidence of white matter demyelinating disease. 3. Normal intracranial MRV. No evidence of dural venous sinus thrombosis. Electronically Signed   By: Odessa FlemingH  Hall M.D.   On: 04/07/2021 08:06    Procedures Procedures    Medications Ordered in ED Medications  gadobutrol (GADAVIST) 1 MMOL/ML injection 10 mL (10 mLs Intravenous Contrast Given 04/07/21 [id number])    ED Course/ Medical Decision Making/ A&P Clinical Course as of 04/07/21 1013  Wed Apr 07, 2021  0915 Dr. Thomasena Edisollins from neuro has been called and will evaluate patient [GL]    Clinical Course User Index [GL] Darien Mignogna, Finis BudGrace C, PA-C                           Medical Decision Making Amount and/or Complexity of Data Reviewed Independent Historian:     Details: ind historian External Data Reviewed: notes.    Details: previous EDP note Labs: ordered. Decision-making details documented in ED Course. Radiology: ordered and independent interpretation performed. Decision-making details documented in ED Course. ECG/medicine tests: ordered and independent interpretation performed. Decision-making details documented in ED Course. Discussion of management or test interpretation with external  provider(s): Discussed this case extensively with Dr. Thomasena Edisollins, Neurologist  Risk Prescription drug management. Decision regarding hospitalization.   This is a 36 y.o. female who presents to the ED with neurological symptoms concerning for possible TIA. She had a left sided headache, left facial numbness, swallowing difficulties, and word finding difficulties for several hours yesterday afternoon starting at 1100. [facility] was [facility] and transferred to our ED for completion of MRI to r/o TIA v Stroke v demyelinating disease v dural venous thrombosis.  Her vitals have been stable. She is asymptomatic here.  Initial workup at [facility] was reviewed. Hgb found to be 10.4, K 3.4, Ca 8.6, pregnancy negative, COVID/Flu CT head negative  She underwent an MRI Brain W and Wo as well as an MRV head W and Wo here.  She was found to have a solitary small 10 mm cortical area of abnormal diffusion and SWI in the right middle frontal gyrus that is most suggestive of an acute cortical ischemia.  There is no associated T2 or flair signal abnormality.  No abnormal enhancement or dural thickening.  MRV with no evidence of dural venous thrombosis.  It appears patient has had a very small ischemic stroke. Unclear cause at this time as she is fairly young. She does have RF such as obesity and BP is moderately elevated.   Dr. Thomasena Edisollins from neuro was called.  He came and evaluated patient.  Plan at this time will be to admit to hospitalist service for ischemic stroke work-up.  I have discussed this case with my attending physician who agrees and has made changes to the plan accordingly. Ophelia CharterYates, from admitting team to accept patient.  Portions of this note were generated with Scientist, clinical (histocompatibility and immunogenetics)Dragon dictation software. Dictation errors may occur despite best attempts at proofreading.   Final Clinical Impression(s) / ED Diagnoses Final diagnoses:  Cerebrovascular accident (CVA), unspecified  mechanism [facility](HCC)    Rx / DC Orders ED Discharge Orders     None         Claudie LeachLoeffler, Tarin Johndrow C, PA-C 04/07/21 1015    Virgina Norfolkuratolo, Adam, DO 04/07/21 1032

## 2021-04-07 NOTE — Progress Notes (Signed)
Received from ED on cart.  Oriented to room and surroundings.  All questions answered.  Stroke education started.

## 2021-04-07 NOTE — Assessment & Plan Note (Signed)
-  Patient presenting with acute onset of left face numbness/tingling with facial droop; symptoms have completely resolved -Concerning for TIA/CVA -MRI confirms small cortical CVA in the R middle frontal gyrus -Aspirin has been given to reduce stroke mortality and decrease morbidity -Will place in observation status for further evaluation -Telemetry monitoring -Echo unremarkable -Risk stratification with FLP, A1c; will also check TSH and UDS -PT/OT/ST/Nutrition Consults  HTN -Allow permissive HTN for now -Treat BP only if >220/120, and then with goal of 15% reduction -reports prior h/o HTN but not on medications since her weight loss surgery

## 2021-04-07 NOTE — H&P (Signed)
History and Physical    Patient: Sara Buckley QHU:[id number] DOB: 20-Jun-1985 DOA: 04/07/2021 DOS: the patient was seen and examined on 04/07/2021 PCP: Pcp, No  Patient coming from: Home - lives alone; NOK: Mother, Sara Buckley, [phone]   Chief Complaint: stroke-like symptoms  HPI: Sara Buckley is a 36 y.o. female with medical history significant of HTN; HLD; and morbid obesity presenting with Buckley facial numbness/tingling.   She reports that she had headache upon awakening yesterday, thought it was related to how she slept.  Within a couple of hours, she developed Buckley face numbness and tingling.  She looked at herself in the mirror and noticed that her smile was subtly less on the Buckley.  She also tried to drink water and drooled it.  She decided to come to the ER for evaluation.  She was seen at [facility] and needed an MRI; there was no ambulance transport available so she was signed out and her mother drove her to [facility].  Her symptoms have completely resolved.    ER Course:  Needs admission, small stroke.  Doesn't see doctors regularly.  Asymptomatic.  Neurology has seen.     Review of Systems: As mentioned in the history of present illness. All other systems reviewed and are negative. Past Medical History:  Diagnosis Date   Hypertension    Morbid obesity with BMI of 45.0-49.9, adult (HCC)    s/p weight loss surgery with 80 pound net weight loss   Past Surgical History:  Procedure Laterality Date   GASTRIC BYPASS     Social History:  reports that she has never smoked. She has never used smokeless tobacco. She reports that she does not currently use alcohol. She reports that she does not currently use drugs.  Allergies  Allergen Reactions   Fluticasone-Salmeterol Swelling   Peanut Oil Swelling    Facial swelling. ALL tree nuts Facial swelling    Other Swelling    Pt states she has swelling in face and body went numb. States she went into anaphylactic shock.    Family  History  Problem Relation Age of Onset   Stroke Maternal Grandmother    Stroke Maternal Grandfather    Stroke Cousin     Prior to Admission medications   Medication Sig Start Date End Date Taking? Authorizing Provider  EPINEPHrine 0.3 mg/0.3 mL IJ SOAJ injection Inject 0.3 mg into the muscle as needed for anaphylaxis. 07/27/20   Pricilla Loveless, MD    Physical Exam: Vitals:   04/07/21 0200 04/07/21 0322 04/07/21 0819 04/07/21 1557  BP: (!) 147/97 (!) 150/90 (!) 154/99 (!) 165/97  Pulse: 80 66 80 74  Resp: 19 17 18 16   Temp:   99 F (37.2 C) 98.7 F (37.1 C)  TempSrc:   Oral Oral  SpO2: 100% 100% 100% 100%  Weight:      Height:       General:  Appears calm and comfortable and is in NAD Eyes:  PERRL, EOMI, normal lids, iris ENT:  grossly normal hearing, lips & tongue, mmm; appropriate dentition Neck:  no LAD, masses or thyromegaly Cardiovascular:  RRR, no m/r/g. No LE edema.  Respiratory:   CTA bilaterally with no wheezes/rales/rhonchi.  Normal respiratory effort. Abdomen:  soft, NT, ND Skin:  no rash or induration seen on limited exam Musculoskeletal:  grossly normal tone BUE/BLE, good ROM, no bony abnormality Psychiatric:  grossly normal mood and affect, speech fluent and appropriate, AOx3 Neurologic:  CN 2-12 grossly intact, moves all extremities in  coordinated fashion, sensation intact   Radiological Exams on Admission: Independently reviewed - see discussion in A/P where applicable  CT ANGIO HEAD NECK W WO CM  Result Date: 04/07/2021 CLINICAL DATA:  Stroke/TIA, Buckley-sided headache starting 129, Buckley cheek paresthesia and swallowing difficulties EXAM: CT ANGIOGRAPHY HEAD AND NECK TECHNIQUE: Multidetector CT imaging of the head and neck was performed using the standard protocol during bolus administration of intravenous contrast. Multiplanar CT image reconstructions and MIPs were obtained to evaluate the vascular anatomy. Carotid stenosis measurements (when applicable) are  obtained utilizing NASCET criteria, using the distal internal carotid diameter as the denominator. RADIATION DOSE REDUCTION: This exam was performed according to the departmental dose-optimization program which includes automated exposure control, adjustment of the mA and/or kV according to patient size and/or use of iterative reconstruction technique. CONTRAST:  75mL OMNIPAQUE IOHEXOL 300 MG/ML  SOLN COMPARISON:  Brain MRI/MRV and noncontrast CT head obtained earlier the same day FINDINGS: CTA NECK FINDINGS Aortic arch: The aortic arch is normal. The origins of the major branch vessels are patent. There is a common origin of the brachiocephalic and Buckley common carotid arteries, a normal variant. The subclavian arteries are patent. Right carotid system: The right common, internal, and external carotid arteries are patent, without hemodynamically significant stenosis or occlusion. There is no dissection or aneurysm. There is a medialized retropharyngeal course of the right internal carotid artery. Buckley carotid system: The Buckley common, internal, and external carotid arteries are patent, without hemodynamically significant stenosis or occlusion. There is no dissection or aneurysm. Vertebral arteries: Vertebral arteries are patent, without hemodynamically significant stenosis or occlusion. There is no dissection or aneurysm. Skeleton: There is no acute osseous abnormality or aggressive osseous lesion. There is no visible canal hematoma. Other neck: The soft tissues are unremarkable. Upper chest: The imaged lung apices are clear. Review of the MIP images confirms the above findings CTA HEAD FINDINGS Anterior circulation: The intracranial ICAs are patent. The bilateral MCAs are patent. The bilateral ACAs are patent. The anterior communicating artery is normal. There is no aneurysm or AVM. Posterior circulation: The bilateral V4 segments are patent. The PICA is identified bilaterally. The basilar artery is patent. The  bilateral PCAs are patent. The posterior communicating arteries are not definitely seen. There is no aneurysm or AVM. Venous sinuses: Patent. Anatomic variants: None. Review of the MIP images confirms the above findings IMPRESSION: Normal CTA of the head and neck. Electronically Signed   By: Lesia Hausen M.D.   On: 04/07/2021 12:07   CT Head Wo Contrast  Result Date: 04/07/2021 CLINICAL DATA:  Neuro deficit, stroke suspected EXAM: CT HEAD WITHOUT CONTRAST TECHNIQUE: Contiguous axial images were obtained from the base of the skull through the vertex without intravenous contrast. RADIATION DOSE REDUCTION: This exam was performed according to the departmental dose-optimization program which includes automated exposure control, adjustment of the mA and/or kV according to patient size and/or use of iterative reconstruction technique. COMPARISON:  None. FINDINGS: Brain: No evidence of acute infarction, hemorrhage, cerebral edema, mass, mass effect, or midline shift. No hydrocephalus or extra-axial fluid collection. Vascular: No hyperdense vessel. Skull: Normal. Negative for fracture or focal lesion. Sinuses/Orbits: No acute finding. Other: The mastoid air cells are well aerated. IMPRESSION: IMPRESSION No acute intracranial process. No evidence of acute infarct or hemorrhage. Electronically Signed   By: Wiliam Ke M.D.   On: 04/07/2021 02:56   MR Brain W and Wo Contrast  Result Date: 04/07/2021 CLINICAL DATA:  36 year old female with headache and  neurologic deficit. Query demyelinating disease or venous sinus thrombosis. EXAM: MRI HEAD WITHOUT AND WITH CONTRAST MR VENOGRAM HEAD WITHOUT AND WITH CONTRAST TECHNIQUE: Multiplanar, multi-echo pulse sequences of the brain and surrounding structures were acquired without and with intravenous contrast. Angiographic images of the intracranial venous structures were acquired using MRV technique without and with intravenous contrast. CONTRAST:  65mL GADAVIST GADOBUTROL 1  MMOL/ML IV SOLN COMPARISON:  Head CT 0254 hours today. FINDINGS: MRI HEAD WITHOUT AND WITH CONTRAST Brain: Small focus of cortical restricted diffusion (approximately 10 mm series 5, image 98) in the right middle frontal gyrus (series 7, image 65), with no convincing T2 or FLAIR hyperintensity at this time (series 11, image 21). But there is a trace focus of susceptibility there on series 2, image 77. No corresponding CT abnormality earlier today. Following contrast, no abnormal enhancement. No other convincing diffusion abnormality. No midline shift, mass effect, evidence of mass lesion, ventriculomegaly, extra-axial collection or acute intracranial hemorrhage. Cervicomedullary junction and pituitary are within normal limits. Normal cerebral volume. Aside from the abnormal DWI, gray and white matter signal is within normal limits throughout the brain. Only minimal nonspecific white matter T2 and FLAIR hyperintensity (posterior Buckley corona radiata series 11, image 18). No chronic cortical encephalomalacia. No abnormal enhancement identified. No dural thickening identified. Vascular: Major dural venous sinuses seem to be enhancing and patent. See dedicated MRV findings below. Major intracranial vascular flow voids are preserved. Skull and upper cervical spine: Negative visible cervical spine. Visualized bone marrow signal is within normal limits. Sinuses/Orbits: Negative.  Paranasal sinuses and mastoids are clear. Other: Visible internal auditory structures appear normal. MR VENOGRAM HEAD WITHOUT AND WITH CONTRAST Pre contrast 2D and 3D intracranial MRV. Preserved flow signal in the superior sagittal sinus, torcula, straight sinus, vein of Galen, internal cerebral veins, bilateral transverse sinuses, sigmoid sinuses and IJ bulbs (right transverse, sigmoid, and IJ appear dominant). Postcontrast images demonstrate expected enhancement of those dural venous sinuses, also the cavernous sinus, and major draining cortical  veins. IMPRESSION: 1. Solitary small 10 mm cortical area of abnormal diffusion and SWI in the right middle frontal gyrus. This is most suggestive of acute cortical ischemia, but there is no associated T2/FLAIR signal abnormality. No abnormal enhancement or dural thickening. 2. Otherwise normal MRI appearance of the brain. No evidence of white matter demyelinating disease. 3. Normal intracranial MRV. No evidence of dural venous sinus thrombosis. Electronically Signed   By: Odessa Fleming M.D.   On: 04/07/2021 08:06   MR MRV HEAD W WO CONTRAST  Result Date: 04/07/2021 CLINICAL DATA:  36 year old female with headache and neurologic deficit. Query demyelinating disease or venous sinus thrombosis. EXAM: MRI HEAD WITHOUT AND WITH CONTRAST MR VENOGRAM HEAD WITHOUT AND WITH CONTRAST TECHNIQUE: Multiplanar, multi-echo pulse sequences of the brain and surrounding structures were acquired without and with intravenous contrast. Angiographic images of the intracranial venous structures were acquired using MRV technique without and with intravenous contrast. CONTRAST:  28mL GADAVIST GADOBUTROL 1 MMOL/ML IV SOLN COMPARISON:  Head CT 0254 hours today. FINDINGS: MRI HEAD WITHOUT AND WITH CONTRAST Brain: Small focus of cortical restricted diffusion (approximately 10 mm series 5, image 98) in the right middle frontal gyrus (series 7, image 65), with no convincing T2 or FLAIR hyperintensity at this time (series 11, image 21). But there is a trace focus of susceptibility there on series 2, image 77. No corresponding CT abnormality earlier today. Following contrast, no abnormal enhancement. No other convincing diffusion abnormality. No midline shift, mass effect,  evidence of mass lesion, ventriculomegaly, extra-axial collection or acute intracranial hemorrhage. Cervicomedullary junction and pituitary are within normal limits. Normal cerebral volume. Aside from the abnormal DWI, gray and white matter signal is within normal limits throughout  the brain. Only minimal nonspecific white matter T2 and FLAIR hyperintensity (posterior Buckley corona radiata series 11, image 18). No chronic cortical encephalomalacia. No abnormal enhancement identified. No dural thickening identified. Vascular: Major dural venous sinuses seem to be enhancing and patent. See dedicated MRV findings below. Major intracranial vascular flow voids are preserved. Skull and upper cervical spine: Negative visible cervical spine. Visualized bone marrow signal is within normal limits. Sinuses/Orbits: Negative.  Paranasal sinuses and mastoids are clear. Other: Visible internal auditory structures appear normal. MR VENOGRAM HEAD WITHOUT AND WITH CONTRAST Pre contrast 2D and 3D intracranial MRV. Preserved flow signal in the superior sagittal sinus, torcula, straight sinus, vein of Galen, internal cerebral veins, bilateral transverse sinuses, sigmoid sinuses and IJ bulbs (right transverse, sigmoid, and IJ appear dominant). Postcontrast images demonstrate expected enhancement of those dural venous sinuses, also the cavernous sinus, and major draining cortical veins. IMPRESSION: 1. Solitary small 10 mm cortical area of abnormal diffusion and SWI in the right middle frontal gyrus. This is most suggestive of acute cortical ischemia, but there is no associated T2/FLAIR signal abnormality. No abnormal enhancement or dural thickening. 2. Otherwise normal MRI appearance of the brain. No evidence of white matter demyelinating disease. 3. Normal intracranial MRV. No evidence of dural venous sinus thrombosis. Electronically Signed   By: Odessa FlemingH  Hall M.D.   On: 04/07/2021 08:06   ECHOCARDIOGRAM COMPLETE BUBBLE STUDY  Result Date: 04/07/2021    ECHOCARDIOGRAM REPORT   Patient Name:   Sara Buckley Date of Exam: 04/07/2021 Medical Rec #:  [id number]       Height:       68.0 in Accession #:    [id number]      Weight:       315.9 lb Date of Birth:  [birth year]       BSA:          2.482 m Patient Age:    35 years         BP:           154/99 mmHg Patient Gender: F               HR:           77 bpm. Exam Location:  Inpatient Procedure: 2D Echo Indications:    stroke  History:        Patient has no prior history of Echocardiogram examinations.                 Signs/Symptoms:Syncope.  Sonographer:    Delcie RochLauren Pennington Buckley Referring Phys: [id number] Reyne DumasSTEVI W [facility] CenterOBERMAN IMPRESSIONS  1. Buckley ventricular ejection fraction, by estimation, is 60 to 65%. The Buckley ventricle has normal function. The Buckley ventricle has no regional wall motion abnormalities. There is mild concentric Buckley ventricular hypertrophy. Buckley ventricular diastolic parameters were normal.  2. Right ventricular systolic function is normal. The right ventricular size is normal. There is normal pulmonary artery systolic pressure.  3. The mitral valve is normal in structure. Trivial mitral valve regurgitation. No evidence of mitral stenosis.  4. Tricuspid valve regurgitation is mild to moderate.  5. The aortic valve is normal in structure. Aortic valve regurgitation is not visualized. Aortic valve sclerosis is present, with no evidence of aortic valve stenosis.  6. The inferior vena  cava is normal in size with greater than 50% respiratory variability, suggesting right atrial pressure of 3 mmHg.  7. Agitated saline contrast bubble study was negative, with no evidence of any interatrial shunt. Comparison(s): No prior Echocardiogram. FINDINGS  Buckley Ventricle: Buckley ventricular ejection fraction, by estimation, is 60 to 65%. The Buckley ventricle has normal function. The Buckley ventricle has no regional wall motion abnormalities. The Buckley ventricular internal cavity size was normal in size. There is  mild concentric Buckley ventricular hypertrophy. Buckley ventricular diastolic parameters were normal. Right Ventricle: The right ventricular size is normal. No increase in right ventricular wall thickness. Right ventricular systolic function is normal. There is normal pulmonary artery systolic  pressure. The tricuspid regurgitant velocity is 1.82 m/s, and  with an assumed right atrial pressure of 3 mmHg, the estimated right ventricular systolic pressure is 16.2 mmHg. Buckley Atrium: Buckley atrial size was normal in size. Right Atrium: Right atrial size was normal in size. Pericardium: There is no evidence of pericardial effusion. Mitral Valve: The mitral valve is normal in structure. Trivial mitral valve regurgitation. No evidence of mitral valve stenosis. Tricuspid Valve: The tricuspid valve is normal in structure. Tricuspid valve regurgitation is mild to moderate. No evidence of tricuspid stenosis. Aortic Valve: The aortic valve is normal in structure. Aortic valve regurgitation is not visualized. Aortic valve sclerosis is present, with no evidence of aortic valve stenosis. Pulmonic Valve: The pulmonic valve was normal in structure. Pulmonic valve regurgitation is trivial. No evidence of pulmonic stenosis. Aorta: The aortic root is normal in size and structure. Venous: The inferior vena cava is normal in size with greater than 50% respiratory variability, suggesting right atrial pressure of 3 mmHg. IAS/Shunts: No atrial level shunt detected by color flow Doppler. Agitated saline contrast was given intravenously to evaluate for intracardiac shunting. Agitated saline contrast bubble study was negative, with no evidence of any interatrial shunt.  Buckley VENTRICLE PLAX 2D LVIDd:         4.70 cm   Diastology LVIDs:         3.20 cm   LV e' medial:    9.90 cm/s LV PW:         1.20 cm   LV E/e' medial:  8.6 LV IVS:        1.30 cm   LV e' lateral:   11.90 cm/s LVOT diam:     2.10 cm   LV E/e' lateral: 7.2 LV SV:         75 LV SV Index:   30 LVOT Area:     3.46 cm  RIGHT VENTRICLE             IVC RV S prime:     11.70 cm/s  IVC diam: 2.00 cm Buckley ATRIUM             Index        RIGHT ATRIUM           Index LA diam:        3.90 cm 1.57 cm/m   RA Area:     10.80 cm LA Vol (A2C):   65.4 ml 26.35 ml/m  RA Volume:   23.30  ml  9.39 ml/m LA Vol (A4C):   65.8 ml 26.51 ml/m LA Biplane Vol: 71.2 ml 28.69 ml/m  AORTIC VALVE LVOT Vmax:   110.00 cm/s LVOT Vmean:  74.600 cm/s LVOT VTI:    0.217 m  AORTA Ao Root diam: 3.10 cm Ao Asc diam:  3.10 cm MITRAL VALVE               TRICUSPID VALVE MV Area (PHT): 3.48 cm    TR Peak grad:   13.2 mmHg MV Decel Time: 218 msec    TR Vmax:        182.00 cm/s MV E velocity: 85.20 cm/s MV A velocity: 54.10 cm/s  SHUNTS MV E/A ratio:  1.57        Systemic VTI:  0.22 m                            Systemic Diam: 2.10 cm Kardie Tobb DO Electronically signed by Thomasene Ripple DO Signature Date/Time: 04/07/2021/4:14:33 PM    Final     EKG: Independently reviewed.  NSR with rate 80; no evidence of acute ischemia   Labs on Admission: I have personally reviewed the available labs and imaging studies at the time of the admission.  Pertinent labs:    Unremarkable BMP WBC 8 Hgb 10.4 COVID/flu negative   Assessment and Plan: * CVA (cerebral vascular accident) (HCC)- (present on admission) -Patient presenting with acute onset of Buckley face numbness/tingling with facial droop; symptoms have completely resolved -Concerning for TIA/CVA -MRI confirms small cortical CVA in the R middle frontal gyrus -Aspirin has been given to reduce stroke mortality and decrease morbidity -Will place in observation status for further evaluation -Telemetry monitoring -Echo unremarkable -Risk stratification with FLP, A1c; will also check TSH and UDS -PT/OT/ST/Nutrition Consults  HTN -Allow permissive HTN for now -Treat BP only if >220/120, and then with goal of 15% reduction -reports prior h/o HTN but not on medications since her weight loss surgery  Obesity, Class III, BMI 40-49.9 (morbid obesity) (HCC)- (present on admission) -Body mass index is 48.04 kg/m..  -Ongoing weight loss should be encouraged -Outpatient PCP/bariatric medicine/bariatric surgery f/u encouraged       Advance Care Planning:   Code  Status: Full Code   Consults: Neurology; PT/OT/ST/Nutrition/TOC team  Family Communication: None present; her mother was present for most of the evening and she did not request that I call her at the time of admission  Severity of Illness: The appropriate patient status for this patient is OBSERVATION. Observation status is judged to be reasonable and necessary in order to provide the required intensity of service to ensure the patient's safety. The patient's presenting symptoms, physical exam findings, and initial radiographic and laboratory data in the context of their medical condition is felt to place them at decreased risk for further clinical deterioration. Furthermore, it is anticipated that the patient will be medically stable for discharge from the hospital within 2 midnights of admission.   Author: Jonah Blue, MD 04/07/2021 5:45 PM  For on call review [email or web address].

## 2021-04-07 NOTE — ED Notes (Signed)
Pt transferred pov from [facility] for MRI

## 2021-04-07 NOTE — ED Notes (Signed)
Pt ambulated to restroom with steady gait.

## 2021-04-07 NOTE — Consult Note (Signed)
Neurology Consultation  Reason for Consult: MRI brain with abnormal diffusion and SWI in the right middle frontal gyrus. Referring Physician: Dr. Ronnald Nian  CC: Transient slurred speech, left facial weakness, and left face numbness  History is obtained from: Patient, Chart review  HPI: Sara Buckley is a 36 y.o. female with a medical history significant for obesity with a BMI of 48.04 kg/m2 who presented initially to [facility] ED on 1/31 for evaluation of transient left-sided facial numbness and tingling, drooling from the left side of her mouth, and slurred speech. Yesterday morning, she woke up at 05:00 with a headache but states she felt this was her typical headache associated with her menstrual cycle versus sleeping wrongly on her pillow. She states she took some ibuprofen and this resolved. She states she was walking around the house at 11:00 that morning and felt her face go cold and numb. She felt that she may be having an allergic reaction so she tried to drink some water and take some diphenhydramine but noted that the water was running out of the left side of her mouth. She also noted that she was speaking slowly in order to articulate her words yesterday due to slurring and noted that some of the words she was attempting to speak were not spoken correctly. Her slurred speech occurred for a few hours prior to resolution at around 15:00 and she decided yesterday evening to go to the ED for further evaluation.   ROS: A complete ROS was performed and is negative except as noted in the HPI.   History reviewed. No pertinent past medical history.  No family history on file.  Social History:   reports that she has never smoked. She has never used smokeless tobacco. She reports that she does not currently use alcohol. She reports that she does not currently use drugs.  Medications No current facility-administered medications for this encounter.  Current Outpatient Medications:     EPINEPHrine 0.3 mg/0.3 mL IJ SOAJ injection, Inject 0.3 mg into the muscle as needed for anaphylaxis., Disp: 1 each, Rfl: 1  Exam: Current vital signs: BP (!) 154/99 (BP Location: Right Arm)    Pulse 80    Temp 99 F (37.2 C) (Oral)    Resp 18    Ht 5\' 8"  (1.727 m)    Wt (!) 143.3 kg    SpO2 100%    BMI 48.04 kg/m  Vital signs in last 24 hours: Temp:  [97.8 F (36.6 C)-99 F (37.2 C)] 99 F (37.2 C) (02/01 0819) Pulse Rate:  [66-96] 80 (02/01 0819) Resp:  [12-19] 18 (02/01 0819) BP: (147-156)/(90-129) 154/99 (02/01 0819) SpO2:  [97 %-100 %] 100 % (02/01 0819) Weight:  [143.3 kg] 143.3 kg (01/31 2155)  GENERAL: Awake, alert, in no acute distress Psych: Affect appropriate for situation, patient is calm and cooperative with examination Head: Normocephalic and atraumatic, without obvious abnormality EENT: Normal conjunctivae, dry mucous membranes, no OP obstruction LUNGS: Normal respiratory effort. Non-labored breathing on room air CV: Regular rate and rhythm on telemetry ABDOMEN: Soft, non-tender, non-distended Extremities: Warm, well perfused, without obvious deformity  NEURO:  Mental Status: Awake, alert, and oriented to person, place, time, and situation. She is able to provide a clear and coherent history of present illness. Speech/Language: speech is intact without dysarthria.  No aphasia or neglect noted.  Cranial Nerves:  II: PERRL. Visual fields full.  III, IV, VI: EOMI without ptosis or gaze preference.  V: Sensation is intact to light touch and  symmetrical to face.  VII: Face is symmetric resting and smiling.   VIII: Hearing is intact to voice IX, X: Palate elevation is symmetric. Phonation normal.  XI: Normal sternocleidomastoid and trapezius muscle strength XII: Tongue protrudes midline without fasciculations.   Motor: 5/5 strength is all muscle groups without vertical drift. No asymmetry noted.  Tone is normal. Bulk is normal.  Sensation: Intact to light touch  bilaterally in all four extremities. Coordination: No dysmetria noted.  Gait: Stable without the use of assistive device  NIHSS: 0  Labs I have reviewed labs in epic and the results pertinent to this consultation are: CBC    Component Value Date/Time   WBC 8.0 04/07/2021 0234   RBC 4.15 04/07/2021 0234   HGB 10.4 (L) 04/07/2021 0234   HCT 34.0 (L) 04/07/2021 0234   PLT 275 04/07/2021 0234   MCV 81.9 04/07/2021 0234   MCH 25.1 (L) 04/07/2021 0234   MCHC 30.6 04/07/2021 0234   RDW 14.7 04/07/2021 0234   LYMPHSABS 3.1 04/07/2021 0234   MONOABS 0.4 04/07/2021 0234   EOSABS 0.0 04/07/2021 0234   BASOSABS 0.0 04/07/2021 0234   CMP     Component Value Date/Time   NA 139 04/07/2021 0348   K 3.4 (L) 04/07/2021 0348   CL 104 04/07/2021 0348   CO2 25 04/07/2021 0348   GLUCOSE 90 04/07/2021 0348   BUN 14 04/07/2021 0348   CREATININE 0.81 04/07/2021 0348   CALCIUM 8.6 (L) 04/07/2021 0348   GFRNONAA >60 04/07/2021 0348   Lipid Panel  No results found for: CHOL, TRIG, HDL, CHOLHDL, VLDL, LDLCALC, LDLDIRECT No results found for: HGBA1C  Imaging I have reviewed the images obtained:  CT-scan of the brain 2/1: No acute intracranial process. No evidence of acute infarct or hemorrhage.   MRI examination of the brain, MRV wwo 2/1: 1. Solitary small 10 mm cortical area of abnormal diffusion and SWI in the right middle frontal gyrus. This is most suggestive of acute cortical ischemia, but there is no associated T2/FLAIR signal abnormality. No abnormal enhancement or dural thickening. 2. Otherwise normal MRI appearance of the brain. No evidence of white matter demyelinating disease. 3. Normal intracranial MRV. No evidence of dural venous sinus thrombosis.  Assessment: 36 y.o. obese female who presented to the ED for evaluation of transient left facial weakness, left facial paresthesias, and slurred speech occurring 1/31 at 11:00 AM and resolving by 15:00. - Examination is without ongoing  neurologic deficits with an NIHSS of 0. - Imaging as above, pending vessel imaging.  - Presentation is concerning for subacute ischemic stroke with abnormal diffusion and SWI in the right middle frontal gyrus. Will obtain vessel imaging for further evaluation of possible etiology.  - Only known stroke risk factor is patient's obesity.   Recommendations: - HgbA1c, fasting lipid panel - CT angio head and neck - Frequent neuro checks - Echocardiogram - Prophylactic therapy- Antiplatelet med: Aspirin - dose 81 PO daily  - Risk factor modification - Telemetry monitoring - Stroke team to follow  Anibal Henderson, AGAC-NP [facility] Pager: [phone]  The patient was seen and examined with Anibal Henderson, AGAC-NP. The chart and diagnostic studies were reviewed, discussed assessment and agree with plan as plan as outlined in the note above.   Electronically signed by:  Lynnae Sandhoff, MD Page: [phone] 04/07/2021, 6:51 PM

## 2021-04-08 ENCOUNTER — Observation Stay (HOSPITAL_COMMUNITY): Payer: BC Managed Care – PPO

## 2021-04-08 ENCOUNTER — Observation Stay (HOSPITAL_BASED_OUTPATIENT_CLINIC_OR_DEPARTMENT_OTHER): Payer: BC Managed Care – PPO

## 2021-04-08 DIAGNOSIS — I639 Cerebral infarction, unspecified: Secondary | ICD-10-CM

## 2021-04-08 LAB — TSH: TSH: 3.129 u[IU]/mL (ref 0.350–4.500)

## 2021-04-08 LAB — LIPID PANEL
Cholesterol: 167 mg/dL (ref 0–200)
HDL: 44 mg/dL (ref >40)
LDL Cholesterol: 113 mg/dL — ABNORMAL HIGH (ref 0–99)
Total CHOL/HDL Ratio: 3.8 RATIO
Triglycerides: 50 mg/dL (ref <150)
VLDL: 10 mg/dL (ref 0–40)

## 2021-04-08 LAB — HEMOGLOBIN A1C
Hgb A1c MFr Bld: 5.7 % — ABNORMAL HIGH (ref 4.8–5.6)
Mean Plasma Glucose: 117 mg/dL

## 2021-04-08 LAB — ANTITHROMBIN III: AntiThromb III Func: 99 % (ref 75–120)

## 2021-04-08 LAB — SEDIMENTATION RATE: Sed Rate: 45 mm/hr — ABNORMAL HIGH (ref 0–22)

## 2021-04-08 LAB — RAPID URINE DRUG SCREEN, HOSP PERFORMED
Amphetamines: NOT DETECTED
Barbiturates: NOT DETECTED
Benzodiazepines: NOT DETECTED
Cocaine: NOT DETECTED
Opiates: NOT DETECTED
Tetrahydrocannabinol: NOT DETECTED

## 2021-04-08 LAB — HIV ANTIBODY (ROUTINE TESTING W REFLEX): HIV Screen 4th Generation wRfx: NONREACTIVE

## 2021-04-08 MED ORDER — ENSURE MAX PROTEIN PO LIQD
11.0000 [oz_av] | Freq: Every day | ORAL | Status: DC
  Administered 2021-04-08 – 2021-04-09 (×2): 11 [oz_av] via ORAL
  Filled 2021-04-08 (×2): qty 330

## 2021-04-08 MED ORDER — CLOPIDOGREL BISULFATE 75 MG PO TABS
75.0000 mg | ORAL_TABLET | Freq: Every day | ORAL | Status: DC
  Administered 2021-04-08 – 2021-04-09 (×2): 75 mg via ORAL
  Filled 2021-04-08 (×2): qty 1

## 2021-04-08 MED ORDER — ADULT MULTIVITAMIN W/MINERALS CH
1.0000 | ORAL_TABLET | Freq: Two times a day (BID) | ORAL | Status: DC
  Administered 2021-04-09: 1 via ORAL
  Filled 2021-04-08 (×2): qty 1

## 2021-04-08 MED ORDER — CALCIUM CARBONATE ANTACID 500 MG PO CHEW
1.0000 | CHEWABLE_TABLET | Freq: Three times a day (TID) | ORAL | Status: DC
  Administered 2021-04-08 – 2021-04-09 (×3): 200 mg via ORAL
  Filled 2021-04-08 (×3): qty 1

## 2021-04-08 MED ORDER — ATORVASTATIN CALCIUM 40 MG PO TABS
40.0000 mg | ORAL_TABLET | Freq: Every day | ORAL | Status: DC
  Administered 2021-04-08 – 2021-04-09 (×2): 40 mg via ORAL
  Filled 2021-04-08 (×2): qty 1

## 2021-04-08 NOTE — Evaluation (Addendum)
Physical Therapy Evaluation Patient Details Name: Sara Buckley MRN: [id number] DOB: [birth year] Today's Date: 04/08/2021  History of Present Illness  36 y.o. F admitted on 2/1 due to L facial numbness/tingling, and a headache. PMH significant for HTN, gastric bypass, and obesity.  Clinical Impression  Pt is at or close to baseline functioning and should be safe at home. Completed education on "BE FAST". There are no further acute PT needs.  Will sign off at this time.        Recommendations for follow up therapy are one component of a multi-disciplinary discharge planning process, led by the attending physician.  Recommendations may be updated based on patient status, additional functional criteria and insurance authorization.  Follow Up Recommendations No PT follow up    Assistance Recommended at Discharge None  Patient can return home with the following       Equipment Recommendations None recommended by PT  Recommendations for Other Services       Functional Status Assessment Patient has had a recent decline in their functional status and demonstrates the ability to make significant improvements in function in a reasonable and predictable amount of time.     Precautions / Restrictions Precautions Precautions: None      Mobility  Bed Mobility Overal bed mobility: Independent                  Transfers Overall transfer level: Independent Equipment used: None                    Ambulation/Gait Ambulation/Gait assistance: Independent Gait Distance (Feet): 500 Feet Assistive device: None Gait Pattern/deviations: Step-through pattern, WFL(Within Functional Limits)   Gait velocity interpretation: >4.37 ft/sec, indicative of normal walking speed   General Gait Details: steady, age appropriat speeds, not deviation.  Stairs Stairs: Yes Stairs assistance: Modified independent (Device/Increase time), Independent Stair Management: No rails, Alternating  pattern, Forwards Number of Stairs: 7 General stair comments: safe even without rails  Wheelchair Mobility    Modified Rankin (Stroke Patients Only) Modified Rankin (Stroke Patients Only) Pre-Morbid Rankin Score: No symptoms Modified Rankin: No significant disability     Balance Overall balance assessment: Independent                               Standardized Balance Assessment Standardized Balance Assessment : Dynamic Gait Index   Dynamic Gait Index Level Surface: Normal Change in Gait Speed: Normal Gait with Horizontal Head Turns: Normal (but pt felt "off" a bit) Gait with Vertical Head Turns: Normal Gait and Pivot Turn: Normal Step Over Obstacle: Normal Step Around Obstacles: Normal Steps: Normal Total Score: 24       Pertinent Vitals/Pain Pain Assessment Pain Assessment: No/denies pain    Home Living Family/patient expects to be discharged to:: Private residence Living Arrangements: Alone Available Help at Discharge: Family Type of Home: House Home Access: Stairs to enter   Secretary/administrator of Steps: 3-4 steps Alternate Level Stairs-Number of Steps: 8+8 stairs Home Layout: Two level Home Equipment: None      Prior Function Prior Level of Function : Independent/Modified Independent;Working/employed;Driving                     Hand Dominance   Dominant Hand: Right    Extremity/Trunk Assessment   Upper Extremity Assessment Upper Extremity Assessment: Overall WFL for tasks assessed    Lower Extremity Assessment Lower Extremity Assessment: Overall WFL for tasks  assessed    Cervical / Trunk Assessment Cervical / Trunk Assessment: Normal  Communication   Communication: No difficulties  Cognition Arousal/Alertness: Awake/alert Behavior During Therapy: WFL for tasks assessed/performed Overall Cognitive Status: Within Functional Limits for tasks assessed                                           General Comments General comments (skin integrity, edema, etc.): VSS on RA    Exercises     Assessment/Plan    PT Assessment Patient does not need any further PT services  PT Problem List         PT Treatment Interventions      PT Goals (Current goals can be found in the Care Plan section)  Acute Rehab PT Goals PT Goal Formulation: All assessment and education complete, DC therapy    Frequency       Co-evaluation               AM-PAC PT "6 Clicks" Mobility  Outcome Measure Help needed turning from your back to your side while in a flat bed without using bedrails?: None Help needed moving from lying on your back to sitting on the side of a flat bed without using bedrails?: None Help needed moving to and from a bed to a chair (including a wheelchair)?: None Help needed standing up from a chair using your arms (e.g., wheelchair or bedside chair)?: None Help needed to walk in hospital room?: None Help needed climbing 3-5 steps with a railing? : None 6 Click Score: 24    End of Session   Activity Tolerance: Patient tolerated treatment well Patient left: in bed;with call bell/phone within reach Nurse Communication: Mobility status      Time: 5093-2671 PT Time Calculation (min) (ACUTE ONLY): 17 min   Charges:   PT Evaluation $PT Eval Low Complexity: 1 Low          04/08/2021  Jacinto Halim., PT Acute Rehabilitation Services [phone]  (pager) [phone]  (office)  Sara Buckley 04/08/2021, 5:34 PM

## 2021-04-08 NOTE — Evaluation (Signed)
Occupational Therapy Evaluation Patient Details Name: Sara Buckley MRN: [id number] DOB: [birth year] Today's Date: 04/08/2021   History of Present Illness 36 y.o. F admitted on 2/1 due to L facial numbness/tingling, and a headache. PMH significant for HTN, gastric bypass, and obesity.   Clinical Impression   Pt admitted for concerns listed above. PTA pt reported that she was independent with all ADL's and IADL's, including working from home. At this time, pt appears back at her baseline, able to complete all ADL's and functional mobility independently. Functionally, vision and balance are intact with no concerns. Pt has no OT needs at this time, acute OT will sign off.       Recommendations for follow up therapy are one component of a multi-disciplinary discharge planning process, led by the attending physician.  Recommendations may be updated based on patient status, additional functional criteria and insurance authorization.   Follow Up Recommendations  No OT follow up    Assistance Recommended at Discharge None  Patient can return home with the following      Functional Status Assessment  Patient has not had a recent decline in their functional status  Equipment Recommendations  None recommended by OT    Recommendations for Other Services       Precautions / Restrictions Precautions Precautions: None Restrictions Weight Bearing Restrictions: No      Mobility Bed Mobility Overal bed mobility: Independent                  Transfers Overall transfer level: Independent Equipment used: None                      Balance                                           ADL either performed or assessed with clinical judgement   ADL Overall ADL's : At baseline;Independent                                       General ADL Comments: No difficulties     Vision Baseline Vision/History: 1 Wears glasses Ability to See in  Adequate Light: 0 Adequate Patient Visual Report: No change from baseline Vision Assessment?: No apparent visual deficits Additional Comments: Tracking, peripheral, convergence, saccades, and functional acuity all [facility]     Perception     Praxis      Pertinent Vitals/Pain Pain Assessment Pain Assessment: No/denies pain     Hand Dominance Right   Extremity/Trunk Assessment Upper Extremity Assessment Upper Extremity Assessment: Overall WFL for tasks assessed   Lower Extremity Assessment Lower Extremity Assessment: Overall WFL for tasks assessed   Cervical / Trunk Assessment Cervical / Trunk Assessment: Normal   Communication Communication Communication: No difficulties   Cognition Arousal/Alertness: Awake/alert Behavior During Therapy: WFL for tasks assessed/performed Overall Cognitive Status: Within Functional Limits for tasks assessed                                       General Comments  VSS on RA, pt reports she feels back to her baseline, except for some stuttering and L side of face still feels "off"    Exercises  Shoulder Instructions      Home Living Family/patient expects to be discharged to:: Private residence Living Arrangements: Alone Available Help at Discharge: Family Type of Home: House Home Access: Stairs to enter [employer] of Steps: 3-4 steps   Home Layout: Two level Alternate Level Stairs-Number of Steps: 8+8 stairs Alternate Level Stairs-Rails: Right Bathroom Shower/Tub: Chief Strategy Officer: Standard     Home Equipment: None          Prior Functioning/Environment Prior Level of Function : Independent/Modified Independent;Working/employed;Driving                        OT Problem List: Decreased strength;Impaired balance (sitting and/or standing)      OT Treatment/Interventions:      OT Goals(Current goals can be found in the care plan section) Acute Rehab OT Goals Patient  Stated Goal: To go home OT Goal Formulation: All assessment and education complete, DC therapy Time For Goal Achievement: 04/08/21 Potential to Achieve Goals: Good  OT Frequency:      Co-evaluation              AM-PAC OT "6 Clicks" Daily Activity     Outcome Measure Help from another person eating meals?: None Help from another person taking care of personal grooming?: None Help from another person toileting, which includes using toliet, bedpan, or urinal?: None Help from another person bathing (including washing, rinsing, drying)?: None Help from another person to put on and taking off regular upper body clothing?: None Help from another person to put on and taking off regular lower body clothing?: None 6 Click Score: 24   End of Session Nurse Communication: Mobility status  Activity Tolerance: Patient tolerated treatment well Patient left: in bed;with call bell/phone within reach  OT Visit Diagnosis: Unsteadiness on feet (R26.81);Other abnormalities of gait and mobility (R26.89);Muscle weakness (generalized) (M62.81)                Time: 8756-4332 OT Time Calculation (min): 16 min Charges:  OT General Charges $OT Visit: 1 Visit OT Evaluation $OT Eval Moderate Complexity: 1 Mod  Dwight Adamczak H., OTR/L Acute Rehabilitation  Keni Elison Elane Zoi Devine 04/08/2021, 1:22 PM

## 2021-04-08 NOTE — Progress Notes (Signed)
PROGRESS NOTE  Sara Buckley GQQ:[id number] DOB: 06/25/1985 DOA: 04/07/2021 PCP: Pcp, No  HPI/Recap of past 24 hours: Sara Buckley is a 36 y.o. female with medical history significant of HTN; HLD; and morbid obesity presenting with left facial numbness/tingling. She reports that she had headache upon awakening PTA, thought it was related to how she slept.  Within a couple of hours, she developed left face numbness and tingling.  She looked at herself in the mirror and noticed that her smile was subtly less on the left.  She also tried to drink water and drooled it.  She decided to come to the ER for evaluation. [facility], her symptoms have completely resolved here at [facility].  In the ED, MRI showed small right frontal cortical acute infarct.  Neurology consulted, patient admitted for further management.    Today, patient denies any new complaints, her symptoms have since resolved and has not reoccurred.  Denies any chest pain, abdominal pain, nausea/vomiting, fever/chills.   Assessment/Plan: Principal Problem:   Acute CVA (cerebrovascular accident) [facility]) Active Problems:   Essential hypertension   Obesity, Class III, BMI 40-49.9 (morbid obesity) (HCC)   CVA (cerebral vascular accident) (HCC)- (present on admission) Small right frontal cortical acute infarct Presented with acute onset of left face numbness/tingling with facial droop; symptoms have completely resolved MRI confirms small cortical CVA in the R middle frontal gyrus, no evidence of white matter demyelinating disease.  Normal intracranial MRV, no evidence of dural venous sinus thrombosis TIA, no signs of occlusion or stenosis of the vasculature Transcranial Doppler with bubble was negative Bilateral lower extremity Doppler, no evidence of DVT Echo with EF of 60 to 65%, mild concentric LVH, no signs of shunt on bubble study LDL 113, A1c pending Neurology consulted, start aspirin and Plavix for  3 weeks, then aspirin alone, recommend TEE Hypercoagulable panel pending, ordered by neurology Urine drug screen negative, TSH WNL, HIV nonreactive PT/OT/ST-outpatient follow-up  Hyperlipidemia LDL 113 Started on atorvastatin  HTN BP stable for now Reports prior h/o HTN but not on medications since her weight loss surgery May need to restart BP med once discharged   Morbid Obesity, Class III, BMI 40-49.9 Body mass index is 48.04 kg/m Lifestyle modification advised       Malnutrition Type:  Nutrition Problem: Increased nutrient needs Etiology: acute illness   Malnutrition Characteristics:  Signs/Symptoms: estimated needs   Nutrition Interventions:  Interventions: Ensure Enlive (each supplement provides 350kcal and 20 grams of protein), MVI    Estimated body mass index is 48.04 kg/m as calculated from the following:   Height as of this encounter: 5\' 8"  (1.727 m).   Weight as of this encounter: 143.3 kg.     Code Status: Full  Family Communication: None at bedside  Disposition Plan: Status is: Observation The patient remains OBS appropriate and will d/c before 2 midnights.      Consultants: Neurology  Procedures: None  Antimicrobials: None  DVT prophylaxis: Lovenox   Objective: Vitals:   04/08/21 0348 04/08/21 0805 04/08/21 1118 04/08/21 1524  BP: 133/82 (!) 148/95 136/84 (!) 147/93  Pulse: 93 66 81 94  Resp: 16 20 20 20   Temp: 98.6 F (37 C) 98.5 F (36.9 C) 98.2 F (36.8 C) 98.4 F (36.9 C)  TempSrc: Oral Oral Oral Oral  SpO2: 98% 100% 98% 100%  Weight:      Height:        Intake/Output Summary (Last 24 hours) at 04/08/2021 1739  Last data filed at 04/08/2021 1600 Gross per 24 hour  Intake 1409.71 ml  Output 900 ml  Net 509.71 ml   Filed Weights   04/06/21 2155  Weight: (!) 143.3 kg    Exam: General: NAD  Cardiovascular: S1, S2 present Respiratory: CTAB Abdomen: Soft, nontender, nondistended, bowel sounds  present Musculoskeletal: No bilateral pedal edema noted Skin: Normal Psychiatry: Normal mood  Neurology: No obvious focal neurologic deficit noted    Data Reviewed: CBC: Recent Labs  Lab 04/07/21 0234  WBC 8.0  NEUTROABS 4.5  HGB 10.4*  HCT 34.0*  MCV 81.9  PLT 275   Basic Metabolic Panel: Recent Labs  Lab 04/07/21 0348  NA 139  K 3.4*  CL 104  CO2 25  GLUCOSE 90  BUN 14  CREATININE 0.81  CALCIUM 8.6*   GFR: Estimated Creatinine Clearance: 147.8 mL/min (by C-G formula based on SCr of 0.81 mg/dL). Liver Function Tests: No results for input(s): AST, ALT, ALKPHOS, BILITOT, PROT, ALBUMIN in the last 168 hours. No results for input(s): LIPASE, AMYLASE in the last 168 hours. No results for input(s): AMMONIA in the last 168 hours. Coagulation Profile: No results for input(s): INR, PROTIME in the last 168 hours. Cardiac Enzymes: No results for input(s): CKTOTAL, CKMB, CKMBINDEX, TROPONINI in the last 168 hours. BNP (last 3 results) No results for input(s): PROBNP in the last 8760 hours. HbA1C: No results for input(s): HGBA1C in the last 72 hours. CBG: No results for input(s): GLUCAP in the last 168 hours. Lipid Profile: Recent Labs    04/08/21 0223  CHOL 167  HDL 44  LDLCALC 113*  TRIG 50  CHOLHDL 3.8   Thyroid Function Tests: Recent Labs    04/08/21 0223  TSH 3.129   Anemia Panel: No results for input(s): VITAMINB12, FOLATE, FERRITIN, TIBC, IRON, RETICCTPCT in the last 72 hours. Urine analysis: No results found for: COLORURINE, APPEARANCEUR, LABSPEC, PHURINE, GLUCOSEU, HGBUR, BILIRUBINUR, KETONESUR, PROTEINUR, UROBILINOGEN, NITRITE, LEUKOCYTESUR Sepsis Labs: @LABRCNTIP (procalcitonin:4,lacticidven:4)  ) Recent Results (from the past 240 hour(s))  Resp Panel by RT-PCR (Flu A&B, Covid) Nasopharyngeal Swab     Status: None   Collection Time: 04/07/21  3:50 AM   Specimen: Nasopharyngeal Swab; Nasopharyngeal(NP) swabs in vial transport medium  Result  Value Ref Range Status   SARS Coronavirus 2 by RT PCR NEGATIVE NEGATIVE Final    Comment: (NOTE) SARS-CoV-2 target nucleic acids are NOT DETECTED.  The SARS-CoV-2 RNA is generally detectable in upper respiratory specimens during the acute phase of infection. The lowest concentration of SARS-CoV-2 viral copies this assay can detect is 138 copies/mL. A negative result does not preclude SARS-Cov-2 infection and should not be used as the sole basis for treatment or other patient management decisions. A negative result may occur with  improper specimen collection/handling, submission of specimen other than nasopharyngeal swab, presence of viral mutation(s) within the areas targeted by this assay, and inadequate number of viral copies(<138 copies/mL). A negative result must be combined with clinical observations, patient history, and epidemiological information. The expected result is Negative.  Fact Sheet for Patients:  BloggerCourse.com  Fact Sheet for Healthcare Providers:  SeriousBroker.it  This test is no t yet approved or cleared by the [place] FDA and  has been authorized for detection and/or diagnosis of SARS-CoV-2 by FDA under an Emergency Use Authorization (EUA). This EUA will remain  in effect (meaning this test can be used) for the duration of the COVID-19 declaration under Section 564(b)(1) of the Act, 21 U.S.C.section 360bbb-3(b)(1),  unless the authorization is terminated  or revoked sooner.       Influenza A by PCR NEGATIVE NEGATIVE Final   Influenza B by PCR NEGATIVE NEGATIVE Final    Comment: (NOTE) The Xpert Xpress SARS-CoV-2/FLU/RSV plus assay is intended as an aid in the diagnosis of influenza from Nasopharyngeal swab specimens and should not be used as a sole basis for treatment. Nasal washings and aspirates are unacceptable for Xpert Xpress SARS-CoV-2/FLU/RSV testing.  Fact Sheet for  Patients: BloggerCourse.comhttps://www.fda.gov/media/152166/download  Fact Sheet for Healthcare Providers: SeriousBroker.ithttps://www.fda.gov/media/152162/download  This test is not yet approved or cleared by the [place] FDA and has been authorized for detection and/or diagnosis of SARS-CoV-2 by FDA under an Emergency Use Authorization (EUA). This EUA will remain in effect (meaning this test can be used) for the duration of the COVID-19 declaration under Section 564(b)(1) of the Act, 21 U.S.C. section 360bbb-3(b)(1), unless the authorization is terminated or revoked.  Performed at [facility], [address], [place], [place] [address]       Studies: VAS US TRANSCRANIAL DOPPLER W BUBBLES  Result Date: 04/08/2021  Transcranial Doppler with Bubble Patient Name:  Sara Buckley  Date of Exam:   04/08/2021 Medical Rec #: [id number]        Accession #:    [id number] Date of Birth: Apr 15, 1985        Patient Gender: F Patient Age:   [age] years [facility] Procedure:      VAS US TRANSCRANIAL DOPPLER W BUBBLES Referring Phys: PRAMOD SETHI --------------------------------------------------------------------------------  Indications: Stroke. Comparison Study: No prior studies. Performing Technologist: Jean Rosenthalachel Hodge RDMS, RVT  Examination Guidelines: A complete evaluation includes B-mode imaging, spectral Doppler, color Doppler, and power Doppler as needed of all accessible portions of each vessel. Bilateral testing is considered an integral part of a complete examination. Limited examinations for reoccurring indications may be performed as noted.  Summary: No HITS at rest or during Valsalva. Negative transcranial Doppler Bubble study with no evidence of right to left intracardiac communication.  A vascular evaluation was performed. The right middle cerebral artery was studied. An IV was inserted into the patient's right forearm. Verbal informed consent was obtained.  *See table(s) above for  TCD measurements and observations.    Preliminary    VAS US LOWER EXTREMITY VENOUS (DVT)  Result Date: 04/08/2021  Lower Venous DVT Study Patient Name:  Sara Buckley  Date of Exam:   04/08/2021 Medical Rec #: [id number]        Accession #:    [id number] Date of Birth: Apr 15, 1985        Patient Gender: F Patient Age:   [age] years [facility] Procedure:      VAS US LOWER EXTREMITY VENOUS (DVT) Referring Phys: PRAMOD SETHI --------------------------------------------------------------------------------  Indications: Stroke.  Comparison Study: No prior studies. Performing Technologist: Jean Rosenthalachel Hodge RDMS, RVT  Examination Guidelines: A complete evaluation includes B-mode imaging, spectral Doppler, color Doppler, and power Doppler as needed of all accessible portions of each vessel. Bilateral testing is considered an integral part of a complete examination. Limited examinations for reoccurring indications may be performed as noted. The reflux portion of the exam is performed with the patient in reverse Trendelenburg.  +---------+---------------+---------+-----------+----------+--------------+  RIGHT     Compressibility Phasicity Spontaneity Properties Thrombus Aging  +---------+---------------+---------+-----------+----------+--------------+  CFV       Full            Yes       Yes                                    +---------+---------------+---------+-----------+----------+--------------+  SFJ       Full                                                             +---------+---------------+---------+-----------+----------+--------------+  FV Prox   Full                                                             +---------+---------------+---------+-----------+----------+--------------+  FV Mid    Full                                                             +---------+---------------+---------+-----------+----------+--------------+  FV Distal Full                                                              +---------+---------------+---------+-----------+----------+--------------+  PFV       Full                                                             +---------+---------------+---------+-----------+----------+--------------+  POP       Full            Yes       Yes                                    +---------+---------------+---------+-----------+----------+--------------+  PTV       Full                                                             +---------+---------------+---------+-----------+----------+--------------+  PERO      Full                                                             +---------+---------------+---------+-----------+----------+--------------+  Gastroc   Full                                                             +---------+---------------+---------+-----------+----------+--------------+   +---------+---------------+---------+-----------+----------+--------------+  LEFT      Compressibility Phasicity Spontaneity Properties Thrombus Aging  +---------+---------------+---------+-----------+----------+--------------+  CFV       Full            Yes       Yes                                    +---------+---------------+---------+-----------+----------+--------------+  SFJ       Full                                                             +---------+---------------+---------+-----------+----------+--------------+  FV Prox   Full                                                             +---------+---------------+---------+-----------+----------+--------------+  FV Mid    Full                                                             +---------+---------------+---------+-----------+----------+--------------+  FV Distal Full                                                             +---------+---------------+---------+-----------+----------+--------------+  PFV       Full                                                              +---------+---------------+---------+-----------+----------+--------------+  POP       Full            Yes       Yes                                    +---------+---------------+---------+-----------+----------+--------------+  PTV       Full                                                             +---------+---------------+---------+-----------+----------+--------------+  PERO      Full                                                             +---------+---------------+---------+-----------+----------+--------------+  Gastroc   Full                                                             +---------+---------------+---------+-----------+----------+--------------+    Summary: RIGHT: - There is no evidence of deep vein thrombosis in the lower extremity.  - No cystic structure found in the popliteal fossa.  LEFT: - There is no evidence of deep vein thrombosis in the lower extremity.  - No cystic structure found in the popliteal fossa.  *See table(s) above for measurements and observations.    Preliminary     Scheduled Meds:  aspirin EC  81 mg Oral Daily   atorvastatin  40 mg Oral Daily   calcium carbonate  1 tablet Oral TID   clopidogrel  75 mg Oral Daily   enoxaparin (LOVENOX) injection  40 mg Subcutaneous Q24H   ferrous sulfate  325 mg Oral Q breakfast   [START ON 04/09/2021] multivitamin with minerals  1 tablet Oral BID BM   Ensure Max Protein  11 oz Oral Daily    Continuous Infusions:   LOS: 0 days     Briant CedarNkeiruka J Ambert Virrueta, MD [facility]  If 7PM-7AM, please contact night-coverage [email or web address] 04/08/2021, 5:39 PM

## 2021-04-08 NOTE — TOC Initial Note (Signed)
Transition of Care [facility]) - Initial/Assessment Note    Patient Details  Name: Sara Buckley MRN: [id number] Date of Birth: 1985/05/15  [facility] CM/SW Contact:    Kermit Balo, RN Phone Number: 04/08/2021, 4:09 PM  Clinical Narrative:                 Patient is from home alone. She states her mother lives about 30 minutes away. She has a friend that can check in on her.  No DME. No issues with home medications or transportation.  Pt without a PCP. CM was able to obtain an appointment with patient permission and information on the AVS. TOC following for d/c needs.   Expected Discharge Plan: Home/Self Care Barriers to Discharge: Continued Medical Work up   Patient Goals and CMS Choice        Expected Discharge Plan and Services Expected Discharge Plan: Home/Self Care   Discharge Planning Services: CM Consult   Living arrangements for the past 2 months: Single Family Home                                      Prior Living Arrangements/Services Living arrangements for the past 2 months: Single Family Home Lives with:: Self Patient language and need for interpreter reviewed:: Yes Do you feel safe going back to the place where you live?: Yes            Criminal Activity/Legal Involvement Pertinent to Current Situation/Hospitalization: No - Comment as needed  Activities of Daily Living Home Assistive Devices/Equipment: None ADL Screening (condition at time of admission) Patient's cognitive ability adequate to safely complete daily activities?: Yes Is the patient deaf or have difficulty hearing?: No Does the patient have difficulty seeing, even when wearing glasses/contacts?: No Does the patient have difficulty concentrating, remembering, or making decisions?: No Patient able to express need for assistance with ADLs?: Yes Does the patient have difficulty dressing or bathing?: No Independently performs ADLs?: Yes (appropriate for developmental  age) Does the patient have difficulty walking or climbing stairs?: No Weakness of Legs: None Weakness of Arms/Hands: None  Permission Sought/Granted                  Emotional Assessment Appearance:: Appears stated age Attitude/Demeanor/Rapport: Engaged Affect (typically observed): Accepting Orientation: : Oriented to Self, Oriented to Place, Oriented to  Time, Oriented to Situation   Psych Involvement: No (comment)  Admission diagnosis:  Syncope and collapse [R55] Cerebrovascular accident (CVA), unspecified mechanism (HCC) [I63.9] Patient Active Problem List   Diagnosis Date Noted   Syncope and collapse 04/07/2021   Acute CVA (cerebrovascular accident) (HCC) 04/07/2021   Essential hypertension 04/07/2021   Obesity, Class III, BMI 40-49.9 (morbid obesity) (HCC) 04/07/2021   PCP:  Pcp, No Pharmacy:   [id number] [place], [place] - [address] [place] [address] Phone: [phone] Fax: [phone]     Social Determinants of Health (SDOH) Interventions    Readmission Risk Interventions No flowsheet data found.

## 2021-04-08 NOTE — Progress Notes (Addendum)
STROKE TEAM PROGRESS NOTE   SUBJECTIVE (INTERVAL HISTORY) She is assessed alone without family present at the bedside.  Overall her condition is stable. Patient reports she woke up yesterday at 0500 with a headache typical depending on position vs. pending menses. She states she took some ibuprofen and this resolved. She states at 11:00 her face got cold, numb, and tingly and she noticed drooling from L side of mouth. She tested out her speech and found herself speaking slowly with dysarthria and aphasia. Dysarthria resolved around 1500.   This morning, she reports no deficits.   MRI scan of the brain shows small right frontal cortical subacute infarct.  Patient denies any prior history of strokes TIAs, DVT, pulmonary embolism or recurrent miscarriages.  There is no family history of stroke or heart attacks in a young age.  OBJECTIVE Temp:  [98 F (36.7 C)-98.7 F (37.1 C)] 98.2 F (36.8 C) (02/02 1118) Pulse Rate:  [66-93] 81 (02/02 1118) Cardiac Rhythm: Normal sinus rhythm (02/02 0700) Resp:  [16-20] 20 (02/02 1118) BP: (118-165)/(61-97) 136/84 (02/02 1118) SpO2:  [98 %-100 %] 98 % (02/02 1118)  No results for input(s): GLUCAP in the last 168 hours. Recent Labs  Lab 04/07/21 0348  NA 139  K 3.4*  CL 104  CO2 25  GLUCOSE 90  BUN 14  CREATININE 0.81  CALCIUM 8.6*   No results for input(s): AST, ALT, ALKPHOS, BILITOT, PROT, ALBUMIN in the last 168 hours. Recent Labs  Lab 04/07/21 0234  WBC 8.0  NEUTROABS 4.5  HGB 10.4*  HCT 34.0*  MCV 81.9  PLT 275   No results for input(s): CKTOTAL, CKMB, CKMBINDEX, TROPONINI in the last 168 hours. No results for input(s): LABPROT, INR in the last 72 hours. No results for input(s): COLORURINE, LABSPEC, [place], GLUCOSEU, HGBUR, BILIRUBINUR, KETONESUR, PROTEINUR, UROBILINOGEN, NITRITE, LEUKOCYTESUR in the last 72 hours.  Invalid input(s): APPERANCEUR     Component Value Date/Time   CHOL 167 04/08/2021 0223   TRIG 50 04/08/2021 0223    HDL 44 04/08/2021 0223   CHOLHDL 3.8 04/08/2021 0223   VLDL 10 04/08/2021 0223   LDLCALC 113 (H) 04/08/2021 0223   No results found for: HGBA1C    Component Value Date/Time   LABOPIA NONE DETECTED 04/08/2021 0856   COCAINSCRNUR NONE DETECTED 04/08/2021 0856   LABBENZ NONE DETECTED 04/08/2021 0856   AMPHETMU NONE DETECTED 04/08/2021 0856   THCU NONE DETECTED 04/08/2021 0856   LABBARB NONE DETECTED 04/08/2021 0856    No results for input(s): ETH in the last 168 hours.  I have personally reviewed the radiological images below and agree with the radiology interpretations.  CT ANGIO HEAD NECK W WO CM  Result Date: 04/07/2021 CLINICAL DATA:  Stroke/TIA, left-sided headache starting 129, left cheek paresthesia and swallowing difficulties EXAM: CT ANGIOGRAPHY HEAD AND NECK TECHNIQUE: Multidetector CT imaging of the head and neck was performed using the standard protocol during bolus administration of intravenous contrast. Multiplanar CT image reconstructions and MIPs were obtained to evaluate the vascular anatomy. Carotid stenosis measurements (when applicable) are obtained utilizing NASCET criteria, using the distal internal carotid diameter as the denominator. RADIATION DOSE REDUCTION: This exam was performed according to the departmental dose-optimization program which includes automated exposure control, adjustment of the mA and/or kV according to patient size and/or use of iterative reconstruction technique. CONTRAST:  43m OMNIPAQUE IOHEXOL 300 MG/ML  SOLN COMPARISON:  Brain MRI/MRV and noncontrast CT head obtained earlier the same day FINDINGS: CTA NECK FINDINGS Aortic arch:  The aortic arch is normal. The origins of the major branch vessels are patent. There is a common origin of the brachiocephalic and left common carotid arteries, a normal variant. The subclavian arteries are patent. Right carotid system: The right common, internal, and external carotid arteries are patent, without  hemodynamically significant stenosis or occlusion. There is no dissection or aneurysm. There is a medialized retropharyngeal course of the right internal carotid artery. Left carotid system: The left common, internal, and external carotid arteries are patent, without hemodynamically significant stenosis or occlusion. There is no dissection or aneurysm. Vertebral arteries: Vertebral arteries are patent, without hemodynamically significant stenosis or occlusion. There is no dissection or aneurysm. Skeleton: There is no acute osseous abnormality or aggressive osseous lesion. There is no visible canal hematoma. Other neck: The soft tissues are unremarkable. Upper chest: The imaged lung apices are clear. Review of the MIP images confirms the above findings CTA HEAD FINDINGS Anterior circulation: The intracranial ICAs are patent. The bilateral MCAs are patent. The bilateral ACAs are patent. The anterior communicating artery is normal. There is no aneurysm or AVM. Posterior circulation: The bilateral V4 segments are patent. The PICA is identified bilaterally. The basilar artery is patent. The bilateral PCAs are patent. The posterior communicating arteries are not definitely seen. There is no aneurysm or AVM. Venous sinuses: Patent. Anatomic variants: None. Review of the MIP images confirms the above findings IMPRESSION: Normal CTA of the head and neck. Electronically Signed   By: Valetta Mole M.D.   On: 04/07/2021 12:07   CT Head Wo Contrast  Result Date: 04/07/2021 CLINICAL DATA:  Neuro deficit, stroke suspected EXAM: CT HEAD WITHOUT CONTRAST TECHNIQUE: Contiguous axial images were obtained from the base of the skull through the vertex without intravenous contrast. RADIATION DOSE REDUCTION: This exam was performed according to the departmental dose-optimization program which includes automated exposure control, adjustment of the mA and/or kV according to patient size and/or use of iterative reconstruction technique.  COMPARISON:  None. FINDINGS: Brain: No evidence of acute infarction, hemorrhage, cerebral edema, mass, mass effect, or midline shift. No hydrocephalus or extra-axial fluid collection. Vascular: No hyperdense vessel. Skull: Normal. Negative for fracture or focal lesion. Sinuses/Orbits: No acute finding. Other: The mastoid air cells are well aerated. IMPRESSION: IMPRESSION No acute intracranial process. No evidence of acute infarct or hemorrhage. Electronically Signed   By: Merilyn Baba M.D.   On: 04/07/2021 02:56   MR Brain W and Wo Contrast  Result Date: 04/07/2021 CLINICAL DATA:  36 year old female with headache and neurologic deficit. Query demyelinating disease or venous sinus thrombosis. EXAM: MRI HEAD WITHOUT AND WITH CONTRAST MR VENOGRAM HEAD WITHOUT AND WITH CONTRAST TECHNIQUE: Multiplanar, multi-echo pulse sequences of the brain and surrounding structures were acquired without and with intravenous contrast. Angiographic images of the intracranial venous structures were acquired using MRV technique without and with intravenous contrast. CONTRAST:  54m GADAVIST GADOBUTROL 1 MMOL/ML IV SOLN COMPARISON:  Head CT 0254 hours today. FINDINGS: MRI HEAD WITHOUT AND WITH CONTRAST Brain: Small focus of cortical restricted diffusion (approximately 10 mm series 5, image 98) in the right middle frontal gyrus (series 7, image 65), with no convincing T2 or FLAIR hyperintensity at this time (series 11, image 21). But there is a trace focus of susceptibility there on series 2, image 77. No corresponding CT abnormality earlier today. Following contrast, no abnormal enhancement. No other convincing diffusion abnormality. No midline shift, mass effect, evidence of mass lesion, ventriculomegaly, extra-axial collection or acute intracranial hemorrhage. Cervicomedullary  junction and pituitary are within normal limits. Normal cerebral volume. Aside from the abnormal DWI, gray and white matter signal is within normal limits  throughout the brain. Only minimal nonspecific white matter T2 and FLAIR hyperintensity (posterior left corona radiata series 11, image 18). No chronic cortical encephalomalacia. No abnormal enhancement identified. No dural thickening identified. Vascular: Major dural venous sinuses seem to be enhancing and patent. See dedicated MRV findings below. Major intracranial vascular flow voids are preserved. Skull and upper cervical spine: Negative visible cervical spine. Visualized bone marrow signal is within normal limits. Sinuses/Orbits: Negative.  Paranasal sinuses and mastoids are clear. Other: Visible internal auditory structures appear normal. MR VENOGRAM HEAD WITHOUT AND WITH CONTRAST Pre contrast 2D and 3D intracranial MRV. Preserved flow signal in the superior sagittal sinus, torcula, straight sinus, vein of Galen, internal cerebral veins, bilateral transverse sinuses, sigmoid sinuses and IJ bulbs (right transverse, sigmoid, and IJ appear dominant). Postcontrast images demonstrate expected enhancement of those dural venous sinuses, also the cavernous sinus, and major draining cortical veins. IMPRESSION: 1. Solitary small 10 mm cortical area of abnormal diffusion and SWI in the right middle frontal gyrus. This is most suggestive of acute cortical ischemia, but there is no associated T2/FLAIR signal abnormality. No abnormal enhancement or dural thickening. 2. Otherwise normal MRI appearance of the brain. No evidence of white matter demyelinating disease. 3. Normal intracranial MRV. No evidence of dural venous sinus thrombosis. Electronically Signed   By: Genevie Ann M.D.   On: 04/07/2021 08:06   MR MRV HEAD W WO CONTRAST  Result Date: 04/07/2021 CLINICAL DATA:  36 year old female with headache and neurologic deficit. Query demyelinating disease or venous sinus thrombosis. EXAM: MRI HEAD WITHOUT AND WITH CONTRAST MR VENOGRAM HEAD WITHOUT AND WITH CONTRAST TECHNIQUE: Multiplanar, multi-echo pulse sequences of the  brain and surrounding structures were acquired without and with intravenous contrast. Angiographic images of the intracranial venous structures were acquired using MRV technique without and with intravenous contrast. CONTRAST:  58m GADAVIST GADOBUTROL 1 MMOL/ML IV SOLN COMPARISON:  Head CT 0254 hours today. FINDINGS: MRI HEAD WITHOUT AND WITH CONTRAST Brain: Small focus of cortical restricted diffusion (approximately 10 mm series 5, image 98) in the right middle frontal gyrus (series 7, image 65), with no convincing T2 or FLAIR hyperintensity at this time (series 11, image 21). But there is a trace focus of susceptibility there on series 2, image 77. No corresponding CT abnormality earlier today. Following contrast, no abnormal enhancement. No other convincing diffusion abnormality. No midline shift, mass effect, evidence of mass lesion, ventriculomegaly, extra-axial collection or acute intracranial hemorrhage. Cervicomedullary junction and pituitary are within normal limits. Normal cerebral volume. Aside from the abnormal DWI, gray and white matter signal is within normal limits throughout the brain. Only minimal nonspecific white matter T2 and FLAIR hyperintensity (posterior left corona radiata series 11, image 18). No chronic cortical encephalomalacia. No abnormal enhancement identified. No dural thickening identified. Vascular: Major dural venous sinuses seem to be enhancing and patent. See dedicated MRV findings below. Major intracranial vascular flow voids are preserved. Skull and upper cervical spine: Negative visible cervical spine. Visualized bone marrow signal is within normal limits. Sinuses/Orbits: Negative.  Paranasal sinuses and mastoids are clear. Other: Visible internal auditory structures appear normal. MR VENOGRAM HEAD WITHOUT AND WITH CONTRAST Pre contrast 2D and 3D intracranial MRV. Preserved flow signal in the superior sagittal sinus, torcula, straight sinus, vein of Galen, internal cerebral  veins, bilateral transverse sinuses, sigmoid sinuses and IJ bulbs (right transverse,  sigmoid, and IJ appear dominant). Postcontrast images demonstrate expected enhancement of those dural venous sinuses, also the cavernous sinus, and major draining cortical veins. IMPRESSION: 1. Solitary small 10 mm cortical area of abnormal diffusion and SWI in the right middle frontal gyrus. This is most suggestive of acute cortical ischemia, but there is no associated T2/FLAIR signal abnormality. No abnormal enhancement or dural thickening. 2. Otherwise normal MRI appearance of the brain. No evidence of white matter demyelinating disease. 3. Normal intracranial MRV. No evidence of dural venous sinus thrombosis. Electronically Signed   By: Genevie Ann M.D.   On: 04/07/2021 08:06   ECHOCARDIOGRAM COMPLETE BUBBLE STUDY  Result Date: 04/07/2021    ECHOCARDIOGRAM REPORT   Patient Name:   MARAYA GWILLIAM Date of Exam: 04/07/2021 Medical Rec #:  [id number]       Height:       68.0 in Accession #:    [id number]      Weight:       315.9 lb Date of Birth:  28-Mar-1985       BSA:          2.482 m Patient Age:    35 years        BP:           154/99 mmHg Patient Gender: F               HR:           77 bpm. Exam Location:  Inpatient Procedure: 2D Echo Indications:    stroke  History:        Patient has no prior history of Echocardiogram examinations.                 Signs/Symptoms:Syncope.  Sonographer:    Johny Chess RDCS Referring Phys: [id number] [place]  1. Left ventricular ejection fraction, by estimation, is 60 to 65%. The left ventricle has normal function. The left ventricle has no regional wall motion abnormalities. There is mild concentric left ventricular hypertrophy. Left ventricular diastolic parameters were normal.  2. Right ventricular systolic function is normal. The right ventricular size is normal. There is normal pulmonary artery systolic pressure.  3. The mitral valve is normal in structure. Trivial  mitral valve regurgitation. No evidence of mitral stenosis.  4. Tricuspid valve regurgitation is mild to moderate.  5. The aortic valve is normal in structure. Aortic valve regurgitation is not visualized. Aortic valve sclerosis is present, with no evidence of aortic valve stenosis.  6. The inferior vena cava is normal in size with greater than 50% respiratory variability, suggesting right atrial pressure of 3 mmHg.  7. Agitated saline contrast bubble study was negative, with no evidence of any interatrial shunt. Comparison(s): No prior Echocardiogram. FINDINGS  Left Ventricle: Left ventricular ejection fraction, by estimation, is 60 to 65%. The left ventricle has normal function. The left ventricle has no regional wall motion abnormalities. The left ventricular internal cavity size was normal in size. There is  mild concentric left ventricular hypertrophy. Left ventricular diastolic parameters were normal. Right Ventricle: The right ventricular size is normal. No increase in right ventricular wall thickness. Right ventricular systolic function is normal. There is normal pulmonary artery systolic pressure. The tricuspid regurgitant velocity is 1.82 m/s, and  with an assumed right atrial pressure of 3 mmHg, the estimated right ventricular systolic pressure is 55.9 mmHg. Left Atrium: Left atrial size was normal in size. Right Atrium: Right atrial size was normal in size. Pericardium: There  is no evidence of pericardial effusion. Mitral Valve: The mitral valve is normal in structure. Trivial mitral valve regurgitation. No evidence of mitral valve stenosis. Tricuspid Valve: The tricuspid valve is normal in structure. Tricuspid valve regurgitation is mild to moderate. No evidence of tricuspid stenosis. Aortic Valve: The aortic valve is normal in structure. Aortic valve regurgitation is not visualized. Aortic valve sclerosis is present, with no evidence of aortic valve stenosis. Pulmonic Valve: The pulmonic valve was  normal in structure. Pulmonic valve regurgitation is trivial. No evidence of pulmonic stenosis. Aorta: The aortic root is normal in size and structure. Venous: The inferior vena cava is normal in size with greater than 50% respiratory variability, suggesting right atrial pressure of 3 mmHg. IAS/Shunts: No atrial level shunt detected by color flow Doppler. Agitated saline contrast was given intravenously to evaluate for intracardiac shunting. Agitated saline contrast bubble study was negative, with no evidence of any interatrial shunt.  LEFT VENTRICLE PLAX 2D LVIDd:         4.70 cm   Diastology LVIDs:         3.20 cm   LV e' medial:    9.90 cm/s LV PW:         1.20 cm   LV E/e' medial:  8.6 LV IVS:        1.30 cm   LV e' lateral:   11.90 cm/s LVOT diam:     2.10 cm   LV E/e' lateral: 7.2 LV SV:         75 LV SV Index:   30 LVOT Area:     3.46 cm  RIGHT VENTRICLE             IVC RV S prime:     11.70 cm/s  IVC diam: 2.00 cm LEFT ATRIUM             Index        RIGHT ATRIUM           Index LA diam:        3.90 cm 1.57 cm/m   RA Area:     10.80 cm LA Vol (A2C):   65.4 ml 26.35 ml/m  RA Volume:   23.30 ml  9.39 ml/m LA Vol (A4C):   65.8 ml 26.51 ml/m LA Biplane Vol: 71.2 ml 28.69 ml/m  AORTIC VALVE LVOT Vmax:   110.00 cm/s LVOT Vmean:  74.600 cm/s LVOT VTI:    0.217 m  AORTA Ao Root diam: 3.10 cm Ao Asc diam:  3.10 cm MITRAL VALVE               TRICUSPID VALVE MV Area (PHT): 3.48 cm    TR Peak grad:   13.2 mmHg MV Decel Time: 218 msec    TR Vmax:        182.00 cm/s MV E velocity: 85.20 cm/s MV A velocity: 54.10 cm/s  SHUNTS MV E/A ratio:  1.57        Systemic VTI:  0.22 m                            Systemic Diam: 2.10 cm Kardie Tobb DO Electronically signed by Berniece Salines DO Signature Date/Time: 04/07/2021/4:14:33 PM    Final       PHYSICAL EXAM  Temp:  [98 F (36.7 C)-98.7 F (37.1 C)] 98.2 F (36.8 C) (02/02 1118) Pulse Rate:  [66-93] 81 (02/02 1118) Resp:  [16-20] 20 (02/02  1118) BP:  (118-165)/(61-97) 136/84 (02/02 1118) SpO2:  [98 %-100 %] 98 % (02/02 1118)  General -obese young African-American lady, in no apparent distress.  Cardiovascular - Regular rhythm and rate.  Mental Status -  Level of arousal and orientation to time, place, and person were intact. Language including expression, naming, repetition, comprehension was assessed and found intact. Attention span and concentration were normal. Recent and remote memory were intact. Fund of Knowledge was assessed and was intact.  Cranial Nerves II - XII - II - Visual field intact OU. III, IV, VI - Extraocular movements intact. V - Facial sensation intact bilaterally. VII - Facial movement intact bilaterally. VIII - Hearing & vestibular intact bilaterally. X - Palate elevates symmetrically. XI - Chin turning & shoulder shrug intact bilaterally. XII - Tongue protrusion intact.  Motor Strength - The patients strength was normal in all extremities and pronator drift was absent.  Bulk was normal and fasciculations were absent.   Motor Tone - Muscle tone was assessed at the neck and appendages and was normal.  Sensory - Light touch, symmetrical.    Coordination - The patient had normal movements in the hands and feet with no ataxia or dysmetria.  Tremor was absent.  Gait and Station - deferred.   ASSESSMENT/PLAN Ms. Fusaye Wachtel is a 36 y.o. female with history of obesity s/p gastric sleeve admitted for transient L-sided facial numbness and tingling, L drooling, and dysarthria. No tPA given due to outside of time window.    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 No white matter demyelination CTA  No signs of occlusion or stenosis of the vasculature Transcranial Doppler with bubbles: Negative LE Doppler: No evidence of DVT in either LE 2D Echo  LVEF 60-65%, mild concentric LVH, trivial MVR, Mild-mod TVR,  aortic valve sclerosis but no stenosis; no signs of shunt on bubble study LDL 113 HgbA1c pending SCDs for VTE prophylaxis Diet Order             Diet Heart Room service appropriate? Yes; Fluid consistency: Thin  Diet effective ____                   No antithrombotic prior to admission, now on aspirin 81 mg daily and Plavix 75 mg daily for 3 weeks and then aspirin alone Lovenox SQ for DVT prophylaxis.  Patient counseled to be compliant with her antithrombotic medications Ongoing aggressive stroke risk factor management Hypercoagulable panel pending: ANA, Protein C and S, Lupus anticoag, Beta-2-glycoprotein, Homocysteine, Factor 5 Leiden, Prothrombin gene mutation, Cardiolipin antibodies ATIII 99, ESR 45, UDS neg, HIV NR, TSH 3.129 Therapy recommendations:  Pending PT eval Disposition:  Home at d/c  Diabetes Assessment HgbA1c pending goal < 7.0  Hypertension Assessment Stable Goal SBP <150 Long term BP goal normotensive  Hyperlipidemia Home meds:  N/A  LDL 113, goal < 70 Now on Atorvastatin 40 mg  Continue statin at discharge  Other Stroke Risk Factors Obesity, Body mass index is 48.04 kg/m. S/p gastric sleeve Family hx stroke (maternal grandmother in 41s and maternal cousin in 82s)  Other Active Problems None  Hospital day # 0    Rosezetta Schlatter, MD Stroke Neurology- Neuro Psych Resident 04/08/2021 11:23 AM  STROKE MD NOTE : I have personally obtained history,examined this patient, reviewed notes, independently viewed imaging studies, participated in medical decision making and plan of care.ROS completed by me personally  and pertinent positives fully documented  I have made any additions or clarifications directly to the above note. Agree with note above.  She presented with facial droop, paresthesias and weakness secondary to small right frontal cortical MCA branch infarct of cryptogenic etiology.  Recommend check TCD bubble study for PFO, TEE for cardiac source  of embolism, vasculitic and hypercoagulable panel labs.  Lower extremity venous Dopplers for DVT.  Recommend dual antiplatelet therapy aspirin Plavix for 3 weeks followed by aspirin alone and aggressive risk factor modification.  He also appears to be at risk for sleep apnea and may benefit with outpatient polysomnogram for sleep apnea.  Greater than 50% time during this 50-minute visit was spent in counseling and coordination of care and discussion about small stroke and stroke evaluation, prevention and treatment and answering questions.  Antony Contras, MD Medical Director [facility] Pager: [phone] 04/08/2021 4:48 PM   To contact Stroke Continuity provider, please refer to [email or web address] After hours, contact General Neurology

## 2021-04-08 NOTE — Progress Notes (Signed)
TCD w/ bubble and lower extremity venous study completed.   Please see CV Proc for preliminary results.   Aleece Loyd, RDMS, RVT  

## 2021-04-08 NOTE — Progress Notes (Signed)
Initial Nutrition Assessment  DOCUMENTATION CODES:   Morbid obesity  INTERVENTION:  - Ensure Max po daily, each supplement provides 150 kcal and 30 grams of protein.  - MVI with minerals BID and TUMS TID between meals  NUTRITION DIAGNOSIS:   Increased nutrient needs related to acute illness as evidenced by estimated needs.  GOAL:   Patient will meet greater than or equal to 90% of their needs  MONITOR:   PO intake, Supplement acceptance, Labs, Weight trends  REASON FOR ASSESSMENT:   Consult Assessment of nutrition requirement/status  ASSESSMENT:   Pt admitted from home with L facial numbness and tingling. 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   Pt had gastric sleeve surgery in 2018. Pt states that since her surgery she has been eating 3 meals per day with snacks in between. She typically has a protein shake in the morning, bacon and eggs for her 1st meal, for lunch she will have a salad with baked chicken, and then throughout the rest of the day she will have salmon or tuna packs, quest chips, yogurt, cottage cheese with black berries and occasionally she will enjoy some chocolate. She tries to prioritize protein. She states that she currently tries to eat 1500 kcal a day. She is now attending a weight loss clinic [facility]) and is receiving B12 injections there. She is taking MVIs daily. She is typically active and goes to [employer] for exercise and likes to walk.   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  Pt reports that she lost some weight in December after returning from a trip to [place] and having COVID. Limited documentation of weight history in chart however pt's noted to have consistent weight trends. Prior to surgery in 2018 pt noted to weight 370 lbs.  Current weight: 315 lbs (143.3 kg)  Medications: ferrous  sulfate  Labs: potassium 3.4 (L), calcium 8.6 (L), LDL 113 (H)  NUTRITION - FOCUSED PHYSICAL EXAM:  Flowsheet Row Most Recent Value  Orbital Region No depletion  Upper Arm Region No depletion  Thoracic and Lumbar Region No depletion  Buccal Region No depletion  Temple Region No depletion  Clavicle Bone Region No depletion  Clavicle and Acromion Bone Region No depletion  Scapular Bone Region No depletion  Dorsal Hand No depletion  Patellar Region No depletion  Anterior Thigh Region No depletion  Posterior Calf Region No depletion  Edema (RD Assessment) None  Hair Reviewed  Eyes Reviewed  Mouth Reviewed  Skin Reviewed  Nails Unable to assess  [painted nails]       Diet Order:   Diet Order             Diet Heart Room service appropriate? Yes; Fluid consistency: Thin  Diet effective ____                   EDUCATION NEEDS:   Education needs have been addressed  Skin:  Skin Assessment: Reviewed RN Assessment  Last BM:  1/31  Height:   Ht Readings from Last 1 Encounters:  04/07/21 5\' 8"  (1.727 m)    Weight:   Wt Readings from Last 1 Encounters:  04/06/21 (!) 143.3 kg    Ideal Body Weight:  63.6 kg  BMI:  Body mass index is 48.04 kg/m.  Estimated Nutritional Needs:   Kcal:  1800-2000  Protein:  90-105g  Fluid:  >/=1.8L  Drusilla Kanner, RDN, LDN Clinical Nutrition

## 2021-04-08 NOTE — Progress Notes (Signed)
SLP Cancellation Note  Patient Details Name: Sara Buckley MRN: [id number] DOB: Aug 16, 1985   Cancelled treatment:       Reason Eval/Treat Not Completed: SLP screened, no needs identified, will sign off;Other (comment) (SLP reviewed chart and spoke with patient's nurse who reported patient appears to be WNL. SLP to s/o at this time.)   Angela Nevin, MA, CCC-SLP Speech Therapy

## 2021-04-09 DIAGNOSIS — Z823 Family history of stroke: Secondary | ICD-10-CM | POA: Diagnosis not present

## 2021-04-09 DIAGNOSIS — R55 Syncope and collapse: Secondary | ICD-10-CM | POA: Diagnosis present

## 2021-04-09 DIAGNOSIS — R1310 Dysphagia, unspecified: Secondary | ICD-10-CM | POA: Diagnosis present

## 2021-04-09 DIAGNOSIS — I639 Cerebral infarction, unspecified: Principal | ICD-10-CM

## 2021-04-09 DIAGNOSIS — E785 Hyperlipidemia, unspecified: Secondary | ICD-10-CM | POA: Diagnosis present

## 2021-04-09 DIAGNOSIS — Z888 Allergy status to other drugs, medicaments and biological substances status: Secondary | ICD-10-CM | POA: Diagnosis not present

## 2021-04-09 DIAGNOSIS — Z9884 Bariatric surgery status: Secondary | ICD-10-CM | POA: Diagnosis not present

## 2021-04-09 DIAGNOSIS — Z91010 Allergy to peanuts: Secondary | ICD-10-CM | POA: Diagnosis not present

## 2021-04-09 DIAGNOSIS — Z6841 Body Mass Index (BMI) 40.0 and over, adult: Secondary | ICD-10-CM | POA: Diagnosis not present

## 2021-04-09 DIAGNOSIS — I10 Essential (primary) hypertension: Secondary | ICD-10-CM

## 2021-04-09 DIAGNOSIS — Z87892 Personal history of anaphylaxis: Secondary | ICD-10-CM | POA: Diagnosis not present

## 2021-04-09 DIAGNOSIS — R202 Paresthesia of skin: Secondary | ICD-10-CM | POA: Diagnosis present

## 2021-04-09 DIAGNOSIS — R471 Dysarthria and anarthria: Secondary | ICD-10-CM | POA: Diagnosis present

## 2021-04-09 DIAGNOSIS — R4701 Aphasia: Secondary | ICD-10-CM | POA: Diagnosis present

## 2021-04-09 DIAGNOSIS — Z20822 Contact with and (suspected) exposure to covid-19: Secondary | ICD-10-CM | POA: Diagnosis present

## 2021-04-09 DIAGNOSIS — R4781 Slurred speech: Secondary | ICD-10-CM | POA: Diagnosis present

## 2021-04-09 DIAGNOSIS — R200 Anesthesia of skin: Secondary | ICD-10-CM | POA: Diagnosis present

## 2021-04-09 DIAGNOSIS — R29810 Facial weakness: Secondary | ICD-10-CM | POA: Diagnosis present

## 2021-04-09 DIAGNOSIS — R29700 NIHSS score 0: Secondary | ICD-10-CM | POA: Diagnosis present

## 2021-04-09 LAB — CBC WITH DIFFERENTIAL/PLATELET
Abs Immature Granulocytes: 0.03 10*3/uL (ref 0.00–0.07)
Basophils Absolute: 0 10*3/uL (ref 0.0–0.1)
Basophils Relative: 0 %
Eosinophils Absolute: 0.1 10*3/uL (ref 0.0–0.5)
Eosinophils Relative: 2 %
HCT: 31.5 % — ABNORMAL LOW (ref 36.0–46.0)
Hemoglobin: 10 g/dL — ABNORMAL LOW (ref 12.0–15.0)
Immature Granulocytes: 0 %
Lymphocytes Relative: 44 %
Lymphs Abs: 3.2 10*3/uL (ref 0.7–4.0)
MCH: 26 pg (ref 26.0–34.0)
MCHC: 31.7 g/dL (ref 30.0–36.0)
MCV: 81.8 fL (ref 80.0–100.0)
Monocytes Absolute: 0.5 10*3/uL (ref 0.1–1.0)
Monocytes Relative: 7 %
Neutro Abs: 3.5 10*3/uL (ref 1.7–7.7)
Neutrophils Relative %: 47 %
Platelets: 243 10*3/uL (ref 150–400)
RBC: 3.85 MIL/uL — ABNORMAL LOW (ref 3.87–5.11)
RDW: 14.6 % (ref 11.5–15.5)
WBC: 7.4 10*3/uL (ref 4.0–10.5)
nRBC: 0 % (ref 0.0–0.2)

## 2021-04-09 LAB — ENA+DNA/DS+ANTICH+CENTRO+JO...
Anti JO-1: <0.2 AI (ref 0.0–0.9)
Centromere Ab Screen: <0.2 AI (ref 0.0–0.9)
Chromatin Ab SerPl-aCnc: 0.4 AI (ref 0.0–0.9)
ENA SM Ab Ser-aCnc: <0.2 AI (ref 0.0–0.9)
Ribonucleic Protein: 0.3 AI (ref 0.0–0.9)
SSA (Ro) (ENA) Antibody, IgG: 0.2 AI (ref 0.0–0.9)
SSB (La) (ENA) Antibody, IgG: 0.3 AI (ref 0.0–0.9)
Scleroderma (Scl-70) (ENA) Antibody, IgG: <0.2 AI (ref 0.0–0.9)
ds DNA Ab: 1 IU/mL (ref 0–9)

## 2021-04-09 LAB — PROTEIN C, TOTAL: Protein C, Total: 79 % (ref 60–150)

## 2021-04-09 LAB — BASIC METABOLIC PANEL
Anion gap: 8 (ref 5–15)
BUN: 11 mg/dL (ref 6–20)
CO2: 24 mmol/L (ref 22–32)
Calcium: 8.6 mg/dL — ABNORMAL LOW (ref 8.9–10.3)
Chloride: 104 mmol/L (ref 98–111)
Creatinine, Ser: 0.93 mg/dL (ref 0.44–1.00)
GFR, Estimated: >60 mL/min (ref >60)
Glucose, Bld: 95 mg/dL (ref 70–99)
Potassium: 3.6 mmol/L (ref 3.5–5.1)
Sodium: 136 mmol/L (ref 135–145)

## 2021-04-09 LAB — SURGICAL PCR SCREEN
MRSA, PCR: NEGATIVE
Staphylococcus aureus: NEGATIVE

## 2021-04-09 LAB — HOMOCYSTEINE: Homocysteine: 7.1 umol/L (ref 0.0–14.5)

## 2021-04-09 LAB — ANA W/REFLEX IF POSITIVE: Anti Nuclear Antibody (ANA): POSITIVE — AB

## 2021-04-09 MED ORDER — ASPIRIN 81 MG PO TBEC: 81.0000 mg | DELAYED_RELEASE_TABLET | Freq: Every day | ORAL | 0 refills | Status: AC

## 2021-04-09 MED ORDER — CLOPIDOGREL BISULFATE 75 MG PO TABS
75.0000 mg | ORAL_TABLET | Freq: Every day | ORAL | 0 refills | Status: AC
Start: 2021-04-10 — End: 2021-04-29

## 2021-04-09 MED ORDER — CLOPIDOGREL BISULFATE 75 MG PO TABS: 75.0000 mg | ORAL_TABLET | Freq: Every day | ORAL | 0 refills | Status: DC

## 2021-04-09 MED ORDER — ATORVASTATIN CALCIUM 40 MG PO TABS: 40.0000 mg | ORAL_TABLET | Freq: Every day | ORAL | 0 refills | Status: AC

## 2021-04-09 NOTE — Plan of Care (Signed)
°  Problem: Education: °Goal: Knowledge of General Education information will improve °Description: Including pain rating scale, medication(s)/side effects and non-pharmacologic comfort measures °Outcome: Progressing °  °Problem: Health Behavior/Discharge Planning: °Goal: Ability to manage health-related needs will improve °Outcome: Progressing °  °Problem: Clinical Measurements: °Goal: Ability to maintain clinical measurements within normal limits will improve °Outcome: Progressing °Goal: Will remain free from infection °Outcome: Progressing °Goal: Diagnostic test results will improve °Outcome: Progressing °Goal: Respiratory complications will improve °Outcome: Progressing °Goal: Cardiovascular complication will be avoided °Outcome: Progressing °  °Problem: Activity: °Goal: Risk for activity intolerance will decrease °Outcome: Progressing °  °Problem: Nutrition: °Goal: Adequate nutrition will be maintained °Outcome: Progressing °  °Problem: Coping: °Goal: Level of anxiety will decrease °Outcome: Progressing °  °Problem: Elimination: °Goal: Will not experience complications related to bowel motility °Outcome: Progressing °Goal: Will not experience complications related to urinary retention °Outcome: Progressing °  °Problem: Pain Managment: °Goal: General experience of comfort will improve °Outcome: Progressing °  °Problem: Safety: °Goal: Ability to remain free from injury will improve °Outcome: Progressing °  °Problem: Skin Integrity: °Goal: Risk for impaired skin integrity will decrease °Outcome: Progressing °  °Problem: Education: °Goal: Knowledge of disease or condition will improve °Outcome: Progressing °Goal: Knowledge of secondary prevention will improve (SELECT ALL) °Outcome: Progressing °Goal: Knowledge of patient specific risk factors will improve (INDIVIDUALIZE FOR PATIENT) °Outcome: Progressing °Goal: Individualized Educational Video(s) °Outcome: Progressing °  °Problem: Coping: °Goal: Will verbalize  positive feelings about self °Outcome: Progressing °Goal: Will identify appropriate support needs °Outcome: Progressing °  °Problem: Health Behavior/Discharge Planning: °Goal: Ability to manage health-related needs will improve °Outcome: Progressing °  °Problem: Self-Care: °Goal: Ability to participate in self-care as condition permits will improve °Outcome: Progressing °Goal: Verbalization of feelings and concerns over difficulty with self-care will improve °Outcome: Progressing °Goal: Ability to communicate needs accurately will improve °Outcome: Progressing °  °Problem: Nutrition: °Goal: Dietary intake will improve °Outcome: Progressing °  °Problem: Ischemic Stroke/TIA Tissue Perfusion: °Goal: Complications of ischemic stroke/TIA will be minimized °Outcome: Progressing °  °

## 2021-04-09 NOTE — Progress Notes (Addendum)
STROKE TEAM PROGRESS NOTE   SUBJECTIVE (INTERVAL HISTORY) Sara Buckley is assessed alone without family present at the bedside.  Overall her condition is stable.    This morning, Sara Buckley again reports no deficits.   Encourage OSA testing, follow up in 8 weeks Come back for TEE outpatient next week Antithrombin III is negative but most of hypercoagulable labs are not back so far.  ESR is slightly elevated at 45.  TCD bubble study done yesterday was negative for PFO.  Lower extremity venous Dopplers are negative for DVT  OBJECTIVE Temp:  [98.1 F (36.7 C)-98.7 F (37.1 C)] 98.7 F (37.1 C) (02/03 0748) Pulse Rate:  [72-94] 78 (02/03 0748) Resp:  [14-20] 14 (02/03 0748) BP: (130-148)/(77-93) 148/93 (02/03 0748) SpO2:  [96 %-100 %] 100 % (02/03 0748)  No results for input(s): GLUCAP in the last 168 hours. Recent Labs  Lab 04/07/21 0348 04/09/21 0231  NA 139 136  K 3.4* 3.6  CL 104 104  CO2 25 24  GLUCOSE 90 95  BUN 14 11  CREATININE 0.81 0.93  CALCIUM 8.6* 8.6*   No results for input(s): AST, ALT, ALKPHOS, BILITOT, PROT, ALBUMIN in the last 168 hours. Recent Labs  Lab 04/07/21 0234 04/09/21 0231  WBC 8.0 7.4  NEUTROABS 4.5 3.5  HGB 10.4* 10.0*  HCT 34.0* 31.5*  MCV 81.9 81.8  PLT 275 243   No results for input(s): CKTOTAL, CKMB, CKMBINDEX, TROPONINI in the last 168 hours. No results for input(s): LABPROT, INR in the last 72 hours. No results for input(s): COLORURINE, LABSPEC, [place], GLUCOSEU, HGBUR, BILIRUBINUR, KETONESUR, PROTEINUR, UROBILINOGEN, NITRITE, LEUKOCYTESUR in the last 72 hours.  Invalid input(s): APPERANCEUR     Component Value Date/Time   CHOL 167 04/08/2021 0223   TRIG 50 04/08/2021 0223   HDL 44 04/08/2021 0223   CHOLHDL 3.8 04/08/2021 0223   VLDL 10 04/08/2021 0223   LDLCALC 113 (H) 04/08/2021 0223   Lab Results  Component Value Date   HGBA1C 5.7 (H) 04/08/2021      Component Value Date/Time   LABOPIA NONE DETECTED 04/08/2021 0856   COCAINSCRNUR  NONE DETECTED 04/08/2021 0856   LABBENZ NONE DETECTED 04/08/2021 0856   AMPHETMU NONE DETECTED 04/08/2021 0856   THCU NONE DETECTED 04/08/2021 0856   LABBARB NONE DETECTED 04/08/2021 0856    No results for input(s): ETH in the last 168 hours.  I have personally reviewed the radiological images below and agree with the radiology interpretations.  CT ANGIO HEAD NECK W WO CM  Result Date: 04/07/2021 CLINICAL DATA:  Stroke/TIA, left-sided headache starting 129, left cheek paresthesia and swallowing difficulties EXAM: CT ANGIOGRAPHY HEAD AND NECK TECHNIQUE: Multidetector CT imaging of the head and neck was performed using the standard protocol during bolus administration of intravenous contrast. Multiplanar CT image reconstructions and MIPs were obtained to evaluate the vascular anatomy. Carotid stenosis measurements (when applicable) are obtained utilizing NASCET criteria, using the distal internal carotid diameter as the denominator. RADIATION DOSE REDUCTION: This exam was performed according to the departmental dose-optimization program which includes automated exposure control, adjustment of the mA and/or kV according to patient size and/or use of iterative reconstruction technique. CONTRAST:  69m OMNIPAQUE IOHEXOL 300 MG/ML  SOLN COMPARISON:  Brain MRI/MRV and noncontrast CT head obtained earlier the same day FINDINGS: CTA NECK FINDINGS Aortic arch: The aortic arch is normal. The origins of the major branch vessels are patent. There is a common origin of the brachiocephalic and left common carotid arteries, a normal variant.  The subclavian arteries are patent. Right carotid system: The right common, internal, and external carotid arteries are patent, without hemodynamically significant stenosis or occlusion. There is no dissection or aneurysm. There is a medialized retropharyngeal course of the right internal carotid artery. Left carotid system: The left common, internal, and external carotid arteries  are patent, without hemodynamically significant stenosis or occlusion. There is no dissection or aneurysm. Vertebral arteries: Vertebral arteries are patent, without hemodynamically significant stenosis or occlusion. There is no dissection or aneurysm. Skeleton: There is no acute osseous abnormality or aggressive osseous lesion. There is no visible canal hematoma. Other neck: The soft tissues are unremarkable. Upper chest: The imaged lung apices are clear. Review of the MIP images confirms the above findings CTA HEAD FINDINGS Anterior circulation: The intracranial ICAs are patent. The bilateral MCAs are patent. The bilateral ACAs are patent. The anterior communicating artery is normal. There is no aneurysm or AVM. Posterior circulation: The bilateral V4 segments are patent. The PICA is identified bilaterally. The basilar artery is patent. The bilateral PCAs are patent. The posterior communicating arteries are not definitely seen. There is no aneurysm or AVM. Venous sinuses: Patent. Anatomic variants: None. Review of the MIP images confirms the above findings IMPRESSION: Normal CTA of the head and neck. Electronically Signed   By: Valetta Mole M.D.   On: 04/07/2021 12:07   CT Head Wo Contrast  Result Date: 04/07/2021 CLINICAL DATA:  Neuro deficit, stroke suspected EXAM: CT HEAD WITHOUT CONTRAST TECHNIQUE: Contiguous axial images were obtained from the base of the skull through the vertex without intravenous contrast. RADIATION DOSE REDUCTION: This exam was performed according to the departmental dose-optimization program which includes automated exposure control, adjustment of the mA and/or kV according to patient size and/or use of iterative reconstruction technique. COMPARISON:  None. FINDINGS: Brain: No evidence of acute infarction, hemorrhage, cerebral edema, mass, mass effect, or midline shift. No hydrocephalus or extra-axial fluid collection. Vascular: No hyperdense vessel. Skull: Normal. Negative for  fracture or focal lesion. Sinuses/Orbits: No acute finding. Other: The mastoid air cells are well aerated. IMPRESSION: IMPRESSION No acute intracranial process. No evidence of acute infarct or hemorrhage. Electronically Signed   By: Merilyn Baba M.D.   On: 04/07/2021 02:56   MR Brain W and Wo Contrast  Result Date: 04/07/2021 CLINICAL DATA:  36 year old female with headache and neurologic deficit. Query demyelinating disease or venous sinus thrombosis. EXAM: MRI HEAD WITHOUT AND WITH CONTRAST MR VENOGRAM HEAD WITHOUT AND WITH CONTRAST TECHNIQUE: Multiplanar, multi-echo pulse sequences of the brain and surrounding structures were acquired without and with intravenous contrast. Angiographic images of the intracranial venous structures were acquired using MRV technique without and with intravenous contrast. CONTRAST:  84m GADAVIST GADOBUTROL 1 MMOL/ML IV SOLN COMPARISON:  Head CT 0254 hours today. FINDINGS: MRI HEAD WITHOUT AND WITH CONTRAST Brain: Small focus of cortical restricted diffusion (approximately 10 mm series 5, image 98) in the right middle frontal gyrus (series 7, image 65), with no convincing T2 or FLAIR hyperintensity at this time (series 11, image 21). But there is a trace focus of susceptibility there on series 2, image 77. No corresponding CT abnormality earlier today. Following contrast, no abnormal enhancement. No other convincing diffusion abnormality. No midline shift, mass effect, evidence of mass lesion, ventriculomegaly, extra-axial collection or acute intracranial hemorrhage. Cervicomedullary junction and pituitary are within normal limits. Normal cerebral volume. Aside from the abnormal DWI, gray and white matter signal is within normal limits throughout the brain. Only minimal nonspecific  white matter T2 and FLAIR hyperintensity (posterior left corona radiata series 11, image 18). No chronic cortical encephalomalacia. No abnormal enhancement identified. No dural thickening identified.  Vascular: Major dural venous sinuses seem to be enhancing and patent. See dedicated MRV findings below. Major intracranial vascular flow voids are preserved. Skull and upper cervical spine: Negative visible cervical spine. Visualized bone marrow signal is within normal limits. Sinuses/Orbits: Negative.  Paranasal sinuses and mastoids are clear. Other: Visible internal auditory structures appear normal. MR VENOGRAM HEAD WITHOUT AND WITH CONTRAST Pre contrast 2D and 3D intracranial MRV. Preserved flow signal in the superior sagittal sinus, torcula, straight sinus, vein of Galen, internal cerebral veins, bilateral transverse sinuses, sigmoid sinuses and IJ bulbs (right transverse, sigmoid, and IJ appear dominant). Postcontrast images demonstrate expected enhancement of those dural venous sinuses, also the cavernous sinus, and major draining cortical veins. IMPRESSION: 1. Solitary small 10 mm cortical area of abnormal diffusion and SWI in the right middle frontal gyrus. This is most suggestive of acute cortical ischemia, but there is no associated T2/FLAIR signal abnormality. No abnormal enhancement or dural thickening. 2. Otherwise normal MRI appearance of the brain. No evidence of white matter demyelinating disease. 3. Normal intracranial MRV. No evidence of dural venous sinus thrombosis. Electronically Signed   By: Genevie Ann M.D.   On: 04/07/2021 08:06   MR MRV HEAD W WO CONTRAST  Result Date: 04/07/2021 CLINICAL DATA:  36 year old female with headache and neurologic deficit. Query demyelinating disease or venous sinus thrombosis. EXAM: MRI HEAD WITHOUT AND WITH CONTRAST MR VENOGRAM HEAD WITHOUT AND WITH CONTRAST TECHNIQUE: Multiplanar, multi-echo pulse sequences of the brain and surrounding structures were acquired without and with intravenous contrast. Angiographic images of the intracranial venous structures were acquired using MRV technique without and with intravenous contrast. CONTRAST:  18m GADAVIST  GADOBUTROL 1 MMOL/ML IV SOLN COMPARISON:  Head CT 0254 hours today. FINDINGS: MRI HEAD WITHOUT AND WITH CONTRAST Brain: Small focus of cortical restricted diffusion (approximately 10 mm series 5, image 98) in the right middle frontal gyrus (series 7, image 65), with no convincing T2 or FLAIR hyperintensity at this time (series 11, image 21). But there is a trace focus of susceptibility there on series 2, image 77. No corresponding CT abnormality earlier today. Following contrast, no abnormal enhancement. No other convincing diffusion abnormality. No midline shift, mass effect, evidence of mass lesion, ventriculomegaly, extra-axial collection or acute intracranial hemorrhage. Cervicomedullary junction and pituitary are within normal limits. Normal cerebral volume. Aside from the abnormal DWI, gray and white matter signal is within normal limits throughout the brain. Only minimal nonspecific white matter T2 and FLAIR hyperintensity (posterior left corona radiata series 11, image 18). No chronic cortical encephalomalacia. No abnormal enhancement identified. No dural thickening identified. Vascular: Major dural venous sinuses seem to be enhancing and patent. See dedicated MRV findings below. Major intracranial vascular flow voids are preserved. Skull and upper cervical spine: Negative visible cervical spine. Visualized bone marrow signal is within normal limits. Sinuses/Orbits: Negative.  Paranasal sinuses and mastoids are clear. Other: Visible internal auditory structures appear normal. MR VENOGRAM HEAD WITHOUT AND WITH CONTRAST Pre contrast 2D and 3D intracranial MRV. Preserved flow signal in the superior sagittal sinus, torcula, straight sinus, vein of Galen, internal cerebral veins, bilateral transverse sinuses, sigmoid sinuses and IJ bulbs (right transverse, sigmoid, and IJ appear dominant). Postcontrast images demonstrate expected enhancement of those dural venous sinuses, also the cavernous sinus, and major  draining cortical veins. IMPRESSION: 1. Solitary small 10 mm  cortical area of abnormal diffusion and SWI in the right middle frontal gyrus. This is most suggestive of acute cortical ischemia, but there is no associated T2/FLAIR signal abnormality. No abnormal enhancement or dural thickening. 2. Otherwise normal MRI appearance of the brain. No evidence of white matter demyelinating disease. 3. Normal intracranial MRV. No evidence of dural venous sinus thrombosis. Electronically Signed   By: Genevie Ann M.D.   On: 04/07/2021 08:06   VAS [place] TRANSCRANIAL DOPPLER W BUBBLES  Result Date: 04/08/2021  Transcranial Doppler with Bubble Patient Name:  Sara Buckley  Date of Exam:   04/08/2021 Medical Rec #: [id number]        Accession #:    [id number] Date of Birth: 1986-02-02        Patient Gender: F Patient Age:   63 years Exam Location:  [facility] Procedure:      VAS [place] TRANSCRANIAL DOPPLER W BUBBLES Referring Phys: Nikai Quest --------------------------------------------------------------------------------  Indications: Stroke. Comparison Study: No prior studies. Performing Technologist: Darlin Coco RDMS, RVT  Examination Guidelines: A complete evaluation includes B-mode imaging, spectral Doppler, color Doppler, and power Doppler as needed of all accessible portions of each vessel. Bilateral testing is considered an integral part of a complete examination. Limited examinations for reoccurring indications may be performed as noted.  Summary: No HITS at rest or during Valsalva. Negative transcranial Doppler Bubble study with no evidence of right to left intracardiac communication.  A vascular evaluation was performed. The right middle cerebral artery was studied. An IV was inserted into the patient's right forearm. Verbal informed consent was obtained.  *See table(s) above for TCD measurements and observations.    Preliminary    ECHOCARDIOGRAM COMPLETE BUBBLE STUDY  Result Date: 04/07/2021    ECHOCARDIOGRAM  REPORT   Patient Name:   Sara Buckley Date of Exam: 04/07/2021 Medical Rec #:  [id number]       Height:       68.0 in Accession #:    [id number]      Weight:       315.9 lb Date of Birth:  04/26/1985       BSA:          2.482 m Patient Age:    35 years        BP:           154/99 mmHg Patient Gender: F               HR:           77 bpm. Exam Location:  Inpatient Procedure: 2D Echo Indications:    stroke  History:        Patient has no prior history of Echocardiogram examinations.                 Signs/Symptoms:Syncope.  Sonographer:    Johny Chess RDCS Referring Phys: [id number] [place]  1. Left ventricular ejection fraction, by estimation, is 60 to 65%. The left ventricle has normal function. The left ventricle has no regional wall motion abnormalities. There is mild concentric left ventricular hypertrophy. Left ventricular diastolic parameters were normal.  2. Right ventricular systolic function is normal. The right ventricular size is normal. There is normal pulmonary artery systolic pressure.  3. The mitral valve is normal in structure. Trivial mitral valve regurgitation. No evidence of mitral stenosis.  4. Tricuspid valve regurgitation is mild to moderate.  5. The aortic valve is normal in structure. Aortic valve regurgitation is not visualized.  Aortic valve sclerosis is present, with no evidence of aortic valve stenosis.  6. The inferior vena cava is normal in size with greater than 50% respiratory variability, suggesting right atrial pressure of 3 mmHg.  7. Agitated saline contrast bubble study was negative, with no evidence of any interatrial shunt. Comparison(s): No prior Echocardiogram. FINDINGS  Left Ventricle: Left ventricular ejection fraction, by estimation, is 60 to 65%. The left ventricle has normal function. The left ventricle has no regional wall motion abnormalities. The left ventricular internal cavity size was normal in size. There is  mild concentric left ventricular  hypertrophy. Left ventricular diastolic parameters were normal. Right Ventricle: The right ventricular size is normal. No increase in right ventricular wall thickness. Right ventricular systolic function is normal. There is normal pulmonary artery systolic pressure. The tricuspid regurgitant velocity is 1.82 m/s, and  with an assumed right atrial pressure of 3 mmHg, the estimated right ventricular systolic pressure is 28.3 mmHg. Left Atrium: Left atrial size was normal in size. Right Atrium: Right atrial size was normal in size. Pericardium: There is no evidence of pericardial effusion. Mitral Valve: The mitral valve is normal in structure. Trivial mitral valve regurgitation. No evidence of mitral valve stenosis. Tricuspid Valve: The tricuspid valve is normal in structure. Tricuspid valve regurgitation is mild to moderate. No evidence of tricuspid stenosis. Aortic Valve: The aortic valve is normal in structure. Aortic valve regurgitation is not visualized. Aortic valve sclerosis is present, with no evidence of aortic valve stenosis. Pulmonic Valve: The pulmonic valve was normal in structure. Pulmonic valve regurgitation is trivial. No evidence of pulmonic stenosis. Aorta: The aortic root is normal in size and structure. Venous: The inferior vena cava is normal in size with greater than 50% respiratory variability, suggesting right atrial pressure of 3 mmHg. IAS/Shunts: No atrial level shunt detected by color flow Doppler. Agitated saline contrast was given intravenously to evaluate for intracardiac shunting. Agitated saline contrast bubble study was negative, with no evidence of any interatrial shunt.  LEFT VENTRICLE PLAX 2D LVIDd:         4.70 cm   Diastology LVIDs:         3.20 cm   LV e' medial:    9.90 cm/s LV PW:         1.20 cm   LV E/e' medial:  8.6 LV IVS:        1.30 cm   LV e' lateral:   11.90 cm/s LVOT diam:     2.10 cm   LV E/e' lateral: 7.2 LV SV:         75 LV SV Index:   30 LVOT Area:     3.46 cm   RIGHT VENTRICLE             IVC RV S prime:     11.70 cm/s  IVC diam: 2.00 cm LEFT ATRIUM             Index        RIGHT ATRIUM           Index LA diam:        3.90 cm 1.57 cm/m   RA Area:     10.80 cm LA Vol (A2C):   65.4 ml 26.35 ml/m  RA Volume:   23.30 ml  9.39 ml/m LA Vol (A4C):   65.8 ml 26.51 ml/m LA Biplane Vol: 71.2 ml 28.69 ml/m  AORTIC VALVE LVOT Vmax:   110.00 cm/s LVOT Vmean:  74.600 cm/s LVOT  VTI:    0.217 m  AORTA Ao Root diam: 3.10 cm Ao Asc diam:  3.10 cm MITRAL VALVE               TRICUSPID VALVE MV Area (PHT): 3.48 cm    TR Peak grad:   13.2 mmHg MV Decel Time: 218 msec    TR Vmax:        182.00 cm/s MV E velocity: 85.20 cm/s MV A velocity: 54.10 cm/s  SHUNTS MV E/A ratio:  1.57        Systemic VTI:  0.22 m                            Systemic Diam: 2.10 cm Kardie Tobb DO Electronically signed by Berniece Salines DO Signature Date/Time: 04/07/2021/4:14:33 PM    Final    VAS [place] LOWER EXTREMITY VENOUS (DVT)  Result Date: 04/08/2021  Lower Venous DVT Study Patient Name:  Sara Buckley  Date of Exam:   04/08/2021 Medical Rec #: [id number]        Accession #:    [id number] Date of Birth: 1985-12-13        Patient Gender: F Patient Age:   45 years Exam Location:  [facility] Procedure:      VAS [place] LOWER EXTREMITY VENOUS (DVT) Referring Phys: Talyah Seder --------------------------------------------------------------------------------  Indications: Stroke.  Comparison Study: No prior studies. Performing Technologist: Darlin Coco RDMS, RVT  Examination Guidelines: A complete evaluation includes B-mode imaging, spectral Doppler, color Doppler, and power Doppler as needed of all accessible portions of each vessel. Bilateral testing is considered an integral part of a complete examination. Limited examinations for reoccurring indications may be performed as noted. The reflux portion of the exam is performed with the patient in reverse Trendelenburg.   +---------+---------------+---------+-----------+----------+--------------+  RIGHT     Compressibility Phasicity Spontaneity Properties Thrombus Aging  +---------+---------------+---------+-----------+----------+--------------+  CFV       Full            Yes       Yes                                    +---------+---------------+---------+-----------+----------+--------------+  SFJ       Full                                                             +---------+---------------+---------+-----------+----------+--------------+  FV Prox   Full                                                             +---------+---------------+---------+-----------+----------+--------------+  FV Mid    Full                                                             +---------+---------------+---------+-----------+----------+--------------+  FV Distal Full                                                             +---------+---------------+---------+-----------+----------+--------------+  PFV       Full                                                             +---------+---------------+---------+-----------+----------+--------------+  POP       Full            Yes       Yes                                    +---------+---------------+---------+-----------+----------+--------------+  PTV       Full                                                             +---------+---------------+---------+-----------+----------+--------------+  PERO      Full                                                             +---------+---------------+---------+-----------+----------+--------------+  Gastroc   Full                                                             +---------+---------------+---------+-----------+----------+--------------+   +---------+---------------+---------+-----------+----------+--------------+  LEFT      Compressibility Phasicity Spontaneity Properties Thrombus Aging   +---------+---------------+---------+-----------+----------+--------------+  CFV       Full            Yes       Yes                                    +---------+---------------+---------+-----------+----------+--------------+  SFJ       Full                                                             +---------+---------------+---------+-----------+----------+--------------+  FV Prox   Full                                                             +---------+---------------+---------+-----------+----------+--------------+  FV Mid    Full                                                             +---------+---------------+---------+-----------+----------+--------------+  FV Distal Full                                                             +---------+---------------+---------+-----------+----------+--------------+  PFV       Full                                                             +---------+---------------+---------+-----------+----------+--------------+  POP       Full            Yes       Yes                                    +---------+---------------+---------+-----------+----------+--------------+  PTV       Full                                                             +---------+---------------+---------+-----------+----------+--------------+  PERO      Full                                                             +---------+---------------+---------+-----------+----------+--------------+  Gastroc   Full                                                             +---------+---------------+---------+-----------+----------+--------------+     Summary: RIGHT: - There is no evidence of deep vein thrombosis in the lower extremity.  - No cystic structure found in the popliteal fossa.  LEFT: - There is no evidence of deep vein thrombosis in the lower extremity.  - No cystic structure found in the popliteal fossa.  *See table(s) above for measurements and observations. Electronically signed  by Orlie Pollen on 04/08/2021 at 5:46:14 PM.    Final       PHYSICAL EXAM  Temp:  [98.1 F (36.7 C)-98.7 F (37.1 C)] 98.7 F (37.1 C) (02/03 0748) Pulse Rate:  [72-94] 78 (02/03 0748) Resp:  [14-20] 14 (02/03 0748) BP: (130-148)/(77-93) 148/93 (02/03 0748) SpO2:  [96 %-100 %] 100 % (02/03 0748)  General -obese young African-American lady, in no apparent distress.  Cardiovascular - Regular rhythm and rate.  Mental Status -  Level of arousal and orientation to time, place, and person were intact. Language including expression, naming, repetition, comprehension was assessed and found intact. Attention span and concentration were normal. Recent and remote memory were intact. Fund of Knowledge was assessed and was intact.  Cranial Nerves II - XII - II - Visual field intact OU. III, IV, VI - Extraocular movements intact. V - Facial sensation intact bilaterally. VII - Facial movement intact bilaterally. VIII - Hearing & vestibular intact bilaterally. X - Palate elevates symmetrically. XI - Chin turning & shoulder shrug intact bilaterally. XII - Tongue protrusion intact.  Motor Strength - The patients strength was normal in all extremities and pronator drift was absent.  Bulk was normal and fasciculations were absent.   Motor Tone - Muscle tone was assessed at the neck and appendages and was normal.  Sensory - Light touch, symmetrical.    Coordination - The patient had normal movements in the hands and feet with no ataxia or dysmetria.  Tremor was absent.  Gait and Station - deferred.   ASSESSMENT/PLAN Sara Buckley is a 36 y.o. female with history of obesity s/p gastric sleeve admitted for transient L-sided facial numbness and tingling, L drooling, and dysarthria. No tPA given due to outside of time window.    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 No white matter  demyelination CTA  No signs of occlusion or stenosis of the vasculature Transcranial Doppler with bubbles: Negative LE Doppler: No evidence of DVT in either LE 2D Echo  LVEF 60-65%, mild concentric LVH, trivial MVR, Mild-mod TVR, aortic valve sclerosis but no stenosis; no signs of shunt on bubble study LDL 113 HgbA1c 5.7% SCDs for VTE prophylaxis Patient to have TEE on Monday, which can be completed outpatient No antithrombotic prior to admission, now on aspirin 81 mg daily and Plavix 75 mg daily for 3 weeks and then aspirin alone Lovenox SQ for DVT prophylaxis.  Patient counseled to be compliant with her antithrombotic medications Ongoing aggressive stroke risk factor management Hypercoagulable panel pending: ANA, Protein C and S, Lupus anticoag, Beta-2-glycoprotein, Homocysteine, Factor 5 Leiden, Prothrombin gene mutation, Cardiolipin antibodies ATIII 99, ESR 45, UDS neg, HIV NR, TSH 3.129 Therapy recommendations:  No follow-up indicated Disposition:  Home at d/c  Diabetes Assessment, Pre-Diabetes HgbA1c 5.7%, goal < 7.0 Advised to follow-up with PCP for control   Hypertension Assessment Stable Goal SBP <150 Long term BP goal normotensive  Hyperlipidemia Home meds:  N/A  LDL 113, goal < 70 Now on Atorvastatin 40 mg  Continue statin at discharge  Other Stroke Risk Factors Obesity, Body mass index is 48.04 kg/m. S/p gastric sleeve Family hx stroke (maternal grandmother in 60s and maternal cousin in 39s)  Other Active Problems None  Hospital day # 1    Rosezetta Schlatter, MD Stroke Neurology- Neuro Psych Resident 04/09/2021 8:14 AM  I have personally obtained history,examined this patient, reviewed notes, independently viewed imaging studies, participated in medical decision making and plan of care.ROS completed by me personally and pertinent positives fully documented  I have made any additions or clarifications directly to the above note. Agree with note above.  Patient  is doing well without recurrent stroke or TIA symptoms but remains at risk needs dual antiplatelet therapy for 3 weeks followed  by aspirin alone and aggressive risk factor modification.  Outpatient TEE and follow-up hypercoagulable and vasculitic labs.  Follow-up in stroke clinic in 2 months.  May consider outpatient evaluation for sleep apnea as well.  Greater than 50% time during this 35-minute visit was spent in counseling and coordination Discussion patient entitlements impressions  Antony Contras, MD Medical Director [facility] Pager: [phone] 04/09/2021 1:06 PM  To contact Stroke Continuity provider, please refer to [email or web address] After hours, contact General Neurology

## 2021-04-09 NOTE — Plan of Care (Signed)
°  Problem: Education: Goal: Knowledge of General Education information will improve Description: Including pain rating scale, medication(s)/side effects and non-pharmacologic comfort measures Outcome: Adequate for Discharge   Problem: Health Behavior/Discharge Planning: Goal: Ability to manage health-related needs will improve Outcome: Adequate for Discharge   Problem: Clinical Measurements: Goal: Ability to maintain clinical measurements within normal limits will improve Outcome: Adequate for Discharge Goal: Will remain free from infection Outcome: Adequate for Discharge Goal: Diagnostic test results will improve Outcome: Adequate for Discharge Goal: Respiratory complications will improve Outcome: Adequate for Discharge Goal: Cardiovascular complication will be avoided Outcome: Adequate for Discharge   Problem: Activity: Goal: Risk for activity intolerance will decrease Outcome: Adequate for Discharge   Problem: Nutrition: Goal: Adequate nutrition will be maintained Outcome: Adequate for Discharge   Problem: Coping: Goal: Level of anxiety will decrease Outcome: Adequate for Discharge   Problem: Elimination: Goal: Will not experience complications related to bowel motility Outcome: Adequate for Discharge Goal: Will not experience complications related to urinary retention Outcome: Adequate for Discharge   Problem: Pain Managment: Goal: General experience of comfort will improve Outcome: Adequate for Discharge   Problem: Safety: Goal: Ability to remain free from injury will improve Outcome: Adequate for Discharge   Problem: Skin Integrity: Goal: Risk for impaired skin integrity will decrease Outcome: Adequate for Discharge   Problem: Education: Goal: Knowledge of disease or condition will improve Outcome: Adequate for Discharge Goal: Knowledge of secondary prevention will improve (SELECT ALL) Outcome: Adequate for Discharge Goal: Knowledge of patient specific  risk factors will improve (INDIVIDUALIZE FOR PATIENT) Outcome: Adequate for Discharge Goal: Individualized Educational Video(s) Outcome: Adequate for Discharge   Problem: Coping: Goal: Will verbalize positive feelings about self Outcome: Adequate for Discharge Goal: Will identify appropriate support needs Outcome: Adequate for Discharge   Problem: Health Behavior/Discharge Planning: Goal: Ability to manage health-related needs will improve Outcome: Adequate for Discharge   Problem: Self-Care: Goal: Ability to participate in self-care as condition permits will improve Outcome: Adequate for Discharge Goal: Verbalization of feelings and concerns over difficulty with self-care will improve Outcome: Adequate for Discharge Goal: Ability to communicate needs accurately will improve Outcome: Adequate for Discharge   Problem: Nutrition: Goal: Dietary intake will improve Outcome: Adequate for Discharge   Problem: Ischemic Stroke/TIA Tissue Perfusion: Goal: Complications of ischemic stroke/TIA will be minimized Outcome: Adequate for Discharge

## 2021-04-09 NOTE — Discharge Summary (Signed)
Physician Discharge Summary   Patient: Sara Buckley MRN: [id number] DOB: January 02, 1986  Admit date:     04/07/2021  Discharge date: 04/09/21  Discharge Physician: Alma Friendly   PCP: Pcp, No   Recommendations at discharge:   Follow-up with PCP as scheduled Follow-up with neurology in 8 weeks Office will call you about your TEE part of your work-up for stroke Outpatient sleep study recommended  Discharge Diagnoses: Principal Problem:   Acute CVA (cerebrovascular accident) [facility]) Active Problems:   Essential hypertension   Obesity, Class III, BMI 40-49.9 (morbid obesity) [facility])     Hospital Course: Sara Buckley is a 36 y.o. female with medical history significant of HTN; HLD; and morbid obesity presenting with left facial numbness/tingling. She reports that she had headache upon awakening PTA, thought it was related to how she slept.  Within a couple of hours, she developed left face numbness and tingling.  She looked at herself in the mirror and noticed that her smile was subtly less on the left.  She also tried to drink water and drooled it.  She decided to come to the ER for evaluation. She transferred from [place] down to [facility], her symptoms have completely resolved here at [facility].  In the ED, MRI showed small right frontal cortical acute infarct.  Neurology consulted, patient admitted for further management.    Today, patient denies any new complaints, no further facial numbness/tingling or drooling, denies any chest pain, abdominal pain, shortness of breath, nausea/vomiting, fever/chills.  Patient is advised to follow-up with PCP in 1 week, neurology in about 8 weeks, OSA testing is recommended.  Office will call for outpatient TEE.    Assessment and Plan:  CVA (cerebral vascular accident) ([place])- (present on admission) Small right frontal cortical acute infarct Presented with acute onset of left face numbness/tingling with facial droop; symptoms have completely  resolved MRI confirms small cortical CVA in the R middle frontal gyrus, no evidence of white matter demyelinating disease.  Normal intracranial MRV, no evidence of dural venous sinus thrombosis TIA, no signs of occlusion or stenosis of the vasculature Transcranial Doppler with bubble was negative Bilateral lower extremity Doppler, no evidence of DVT Echo with EF of 60 to 65%, mild concentric LVH, no signs of shunt on bubble study LDL 113, A1c 5.7 Neurology consulted, start aspirin and Plavix for 3 weeks, then aspirin alone, recommend TEE, which will be scheduled as an outpatient Hypercoagulable panel pending, ordered by neurology, they will follow with results and update patient Urine drug screen negative, TSH WNL, HIV nonreactive PT/OT/ST-outpatient follow-up   Hyperlipidemia LDL 113 Started on atorvastatin   HTN BP somewhat stable Reports prior h/o HTN but not on medications since her weight loss surgery PCP to follow-up on BP, may need BP meds upon follow-up   Morbid Obesity, Class III, BMI 40-49.9 Body mass index is 48.04 kg/m Lifestyle modification advised    Consultants: Neurology Procedures performed: None Disposition: Home Diet recommendation:  Cardiac diet  DISCHARGE MEDICATION: Allergies as of 04/09/2021       Reactions   Fluticasone-salmeterol Swelling   Peanut Oil Swelling   Facial swelling. ALL tree nuts Facial swelling   Other Swelling   Pt states she has swelling in face and body went numb. States she went into anaphylactic shock.        Medication List     STOP taking these medications    ibuprofen 200 MG tablet Commonly known as: ADVIL       TAKE these  medications    aspirin 81 MG EC tablet Take 1 tablet (81 mg total) by mouth daily. Swallow whole. Start taking on: April 10, 2021   atorvastatin 40 MG tablet Commonly known as: LIPITOR Take 1 tablet (40 mg total) by mouth daily. Start taking on: April 10, 2021   cholecalciferol 25  MCG (1000 UNIT) tablet Commonly known as: VITAMIN D3 Take 1,000 Units by mouth daily.   clopidogrel 75 MG tablet Commonly known as: PLAVIX Take 1 tablet (75 mg total) by mouth daily for 19 days. Start taking on: April 10, 2021   EPINEPHrine 0.3 mg/0.3 mL Soaj injection Commonly known as: EPI-PEN Inject 0.3 mg into the muscle as needed for anaphylaxis.   ferrous sulfate 325 (65 FE) MG tablet Take 325 mg by mouth daily with breakfast.   Multi-Vitamin tablet Take 1 tablet by mouth daily.   Oyster Shell Calcium/D 500-5 MG-MCG Tabs Take 1 tablet by mouth daily.        Follow-up Information     [facility] Follow up on 04/16/2021.   Specialty: Family Medicine Why: Appt is with Micheline Maze Your appointment time is 2:10 pm. Please arrive early and bring a picture ID, insurance card and current medications. Contact information: [address] [place] [address] [phone]         [facility] Follow up.   Specialty: Neurology Why: Office will call you for a follow up appointment in 8 weeks Contact information: [place] [phone]        Pixie Casino, MD Follow up.   Specialty: Cardiology Why: This office will call you to set up an appointment for your TEE (part of work up for the stroke) Contact information: [address] [address] [place] [address] [phone]                 Discharge Exam: Danley Danker Weights   04/06/21 2155  Weight: (!) 143.3 kg   General: NAD  Cardiovascular: S1, S2 present Respiratory: CTAB Abdomen: Soft, nontender, nondistended, bowel sounds present Musculoskeletal: No bilateral pedal edema noted Skin: Normal Psychiatry: Normal mood   Condition at discharge: stable  The results of significant diagnostics from this hospitalization (including imaging, microbiology, ancillary and  laboratory) are listed below for reference.   Imaging Studies: CT ANGIO HEAD NECK W WO CM  Result Date: 04/07/2021 CLINICAL DATA:  Stroke/TIA, left-sided headache starting 129, left cheek paresthesia and swallowing difficulties EXAM: CT ANGIOGRAPHY HEAD AND NECK TECHNIQUE: Multidetector CT imaging of the head and neck was performed using the standard protocol during bolus administration of intravenous contrast. Multiplanar CT image reconstructions and MIPs were obtained to evaluate the vascular anatomy. Carotid stenosis measurements (when applicable) are obtained utilizing NASCET criteria, using the distal internal carotid diameter as the denominator. RADIATION DOSE REDUCTION: This exam was performed according to the departmental dose-optimization program which includes automated exposure control, adjustment of the mA and/or kV according to patient size and/or use of iterative reconstruction technique. CONTRAST:  64mL OMNIPAQUE IOHEXOL 300 MG/ML  SOLN COMPARISON:  Brain MRI/MRV and noncontrast CT head obtained earlier the same day FINDINGS: CTA NECK FINDINGS Aortic arch: The aortic arch is normal. The origins of the major branch vessels are patent. There is a common origin of the brachiocephalic and left common carotid arteries, a normal variant. The subclavian arteries are patent. Right carotid system: The right common, internal, and external carotid arteries are  patent, without hemodynamically significant stenosis or occlusion. There is no dissection or aneurysm. There is a medialized retropharyngeal course of the right internal carotid artery. Left carotid system: The left common, internal, and external carotid arteries are patent, without hemodynamically significant stenosis or occlusion. There is no dissection or aneurysm. Vertebral arteries: Vertebral arteries are patent, without hemodynamically significant stenosis or occlusion. There is no dissection or aneurysm. Skeleton: There is no acute osseous  abnormality or aggressive osseous lesion. There is no visible canal hematoma. Other neck: The soft tissues are unremarkable. Upper chest: The imaged lung apices are clear. Review of the MIP images confirms the above findings CTA HEAD FINDINGS Anterior circulation: The intracranial ICAs are patent. The bilateral MCAs are patent. The bilateral ACAs are patent. The anterior communicating artery is normal. There is no aneurysm or AVM. Posterior circulation: The bilateral V4 segments are patent. The PICA is identified bilaterally. The basilar artery is patent. The bilateral PCAs are patent. The posterior communicating arteries are not definitely seen. There is no aneurysm or AVM. Venous sinuses: Patent. Anatomic variants: None. Review of the MIP images confirms the above findings IMPRESSION: Normal CTA of the head and neck. Electronically Signed   By: Valetta Mole M.D.   On: 04/07/2021 12:07   CT Head Wo Contrast  Result Date: 04/07/2021 CLINICAL DATA:  Neuro deficit, stroke suspected EXAM: CT HEAD WITHOUT CONTRAST TECHNIQUE: Contiguous axial images were obtained from the base of the skull through the vertex without intravenous contrast. RADIATION DOSE REDUCTION: This exam was performed according to the departmental dose-optimization program which includes automated exposure control, adjustment of the mA and/or kV according to patient size and/or use of iterative reconstruction technique. COMPARISON:  None. FINDINGS: Brain: No evidence of acute infarction, hemorrhage, cerebral edema, mass, mass effect, or midline shift. No hydrocephalus or extra-axial fluid collection. Vascular: No hyperdense vessel. Skull: Normal. Negative for fracture or focal lesion. Sinuses/Orbits: No acute finding. Other: The mastoid air cells are well aerated. IMPRESSION: IMPRESSION No acute intracranial process. No evidence of acute infarct or hemorrhage. Electronically Signed   By: Merilyn Baba M.D.   On: 04/07/2021 02:56   MR Brain W and  Wo Contrast  Result Date: 04/07/2021 CLINICAL DATA:  36 year old female with headache and neurologic deficit. Query demyelinating disease or venous sinus thrombosis. EXAM: MRI HEAD WITHOUT AND WITH CONTRAST MR VENOGRAM HEAD WITHOUT AND WITH CONTRAST TECHNIQUE: Multiplanar, multi-echo pulse sequences of the brain and surrounding structures were acquired without and with intravenous contrast. Angiographic images of the intracranial venous structures were acquired using MRV technique without and with intravenous contrast. CONTRAST:  60mL GADAVIST GADOBUTROL 1 MMOL/ML IV SOLN COMPARISON:  Head CT 0254 hours today. FINDINGS: MRI HEAD WITHOUT AND WITH CONTRAST Brain: Small focus of cortical restricted diffusion (approximately 10 mm series 5, image 98) in the right middle frontal gyrus (series 7, image 65), with no convincing T2 or FLAIR hyperintensity at this time (series 11, image 21). But there is a trace focus of susceptibility there on series 2, image 77. No corresponding CT abnormality earlier today. Following contrast, no abnormal enhancement. No other convincing diffusion abnormality. No midline shift, mass effect, evidence of mass lesion, ventriculomegaly, extra-axial collection or acute intracranial hemorrhage. Cervicomedullary junction and pituitary are within normal limits. Normal cerebral volume. Aside from the abnormal DWI, gray and white matter signal is within normal limits throughout the brain. Only minimal nonspecific white matter T2 and FLAIR hyperintensity (posterior left corona radiata series 11, image 18). No chronic cortical  encephalomalacia. No abnormal enhancement identified. No dural thickening identified. Vascular: Major dural venous sinuses seem to be enhancing and patent. See dedicated MRV findings below. Major intracranial vascular flow voids are preserved. Skull and upper cervical spine: Negative visible cervical spine. Visualized bone marrow signal is within normal limits. Sinuses/Orbits:  Negative.  Paranasal sinuses and mastoids are clear. Other: Visible internal auditory structures appear normal. MR VENOGRAM HEAD WITHOUT AND WITH CONTRAST Pre contrast 2D and 3D intracranial MRV. Preserved flow signal in the superior sagittal sinus, torcula, straight sinus, vein of Galen, internal cerebral veins, bilateral transverse sinuses, sigmoid sinuses and IJ bulbs (right transverse, sigmoid, and IJ appear dominant). Postcontrast images demonstrate expected enhancement of those dural venous sinuses, also the cavernous sinus, and major draining cortical veins. IMPRESSION: 1. Solitary small 10 mm cortical area of abnormal diffusion and SWI in the right middle frontal gyrus. This is most suggestive of acute cortical ischemia, but there is no associated T2/FLAIR signal abnormality. No abnormal enhancement or dural thickening. 2. Otherwise normal MRI appearance of the brain. No evidence of white matter demyelinating disease. 3. Normal intracranial MRV. No evidence of dural venous sinus thrombosis. Electronically Signed   By: Genevie Ann M.D.   On: 04/07/2021 08:06   MR MRV HEAD W WO CONTRAST  Result Date: 04/07/2021 CLINICAL DATA:  36 year old female with headache and neurologic deficit. Query demyelinating disease or venous sinus thrombosis. EXAM: MRI HEAD WITHOUT AND WITH CONTRAST MR VENOGRAM HEAD WITHOUT AND WITH CONTRAST TECHNIQUE: Multiplanar, multi-echo pulse sequences of the brain and surrounding structures were acquired without and with intravenous contrast. Angiographic images of the intracranial venous structures were acquired using MRV technique without and with intravenous contrast. CONTRAST:  61mL GADAVIST GADOBUTROL 1 MMOL/ML IV SOLN COMPARISON:  Head CT 0254 hours today. FINDINGS: MRI HEAD WITHOUT AND WITH CONTRAST Brain: Small focus of cortical restricted diffusion (approximately 10 mm series 5, image 98) in the right middle frontal gyrus (series 7, image 65), with no convincing T2 or FLAIR  hyperintensity at this time (series 11, image 21). But there is a trace focus of susceptibility there on series 2, image 77. No corresponding CT abnormality earlier today. Following contrast, no abnormal enhancement. No other convincing diffusion abnormality. No midline shift, mass effect, evidence of mass lesion, ventriculomegaly, extra-axial collection or acute intracranial hemorrhage. Cervicomedullary junction and pituitary are within normal limits. Normal cerebral volume. Aside from the abnormal DWI, gray and white matter signal is within normal limits throughout the brain. Only minimal nonspecific white matter T2 and FLAIR hyperintensity (posterior left corona radiata series 11, image 18). No chronic cortical encephalomalacia. No abnormal enhancement identified. No dural thickening identified. Vascular: Major dural venous sinuses seem to be enhancing and patent. See dedicated MRV findings below. Major intracranial vascular flow voids are preserved. Skull and upper cervical spine: Negative visible cervical spine. Visualized bone marrow signal is within normal limits. Sinuses/Orbits: Negative.  Paranasal sinuses and mastoids are clear. Other: Visible internal auditory structures appear normal. MR VENOGRAM HEAD WITHOUT AND WITH CONTRAST Pre contrast 2D and 3D intracranial MRV. Preserved flow signal in the superior sagittal sinus, torcula, straight sinus, vein of Galen, internal cerebral veins, bilateral transverse sinuses, sigmoid sinuses and IJ bulbs (right transverse, sigmoid, and IJ appear dominant). Postcontrast images demonstrate expected enhancement of those dural venous sinuses, also the cavernous sinus, and major draining cortical veins. IMPRESSION: 1. Solitary small 10 mm cortical area of abnormal diffusion and SWI in the right middle frontal gyrus. This is most suggestive of  acute cortical ischemia, but there is no associated T2/FLAIR signal abnormality. No abnormal enhancement or dural thickening. 2.  Otherwise normal MRI appearance of the brain. No evidence of white matter demyelinating disease. 3. Normal intracranial MRV. No evidence of dural venous sinus thrombosis. Electronically Signed   By: Genevie Ann M.D.   On: 04/07/2021 08:06   VAS [place] TRANSCRANIAL DOPPLER W BUBBLES  Result Date: 04/09/2021  Transcranial Doppler with Bubble Patient Name:  LIHANNA UNRUH  Date of Exam:   04/08/2021 Medical Rec #: [id number]        Accession #:    [id number] Date of Birth: [birth year]        Patient Gender: F Patient Age:   46 years Exam Location:  [facility] Procedure:      VAS [place] TRANSCRANIAL DOPPLER W BUBBLES Referring Phys: PRAMOD SETHI --------------------------------------------------------------------------------  Indications: Stroke. Comparison Study: No prior studies. Performing Technologist: Darlin Coco RDMS, RVT  Examination Guidelines: A complete evaluation includes B-mode imaging, spectral Doppler, color Doppler, and power Doppler as needed of all accessible portions of each vessel. Bilateral testing is considered an integral part of a complete examination. Limited examinations for reoccurring indications may be performed as noted.  Summary: No HITS at rest or during Valsalva. Negative transcranial Doppler Bubble study with no evidence of right to left intracardiac communication.  A vascular evaluation was performed. The right middle cerebral artery was studied. An IV was inserted into the patient's right forearm. Verbal informed consent was obtained.  NEGATIVE TCD BUBBLE Study with no evidenece of right to left shunt noted. *See table(s) above for TCD measurements and observations.  Diagnosing physician: Antony Contras MD Electronically signed by Antony Contras MD on 04/09/2021 at 12:58:25 PM.    Final    ECHOCARDIOGRAM COMPLETE BUBBLE STUDY  Result Date: 04/07/2021    ECHOCARDIOGRAM REPORT   Patient Name:   DESHANTI HOCKIN Date of Exam: 04/07/2021 Medical Rec #:  [id number]       Height:       68.0 in  Accession #:    [id number]      Weight:       315.9 lb Date of Birth:  1985-05-16       BSA:          2.482 m Patient Age:    35 years        BP:           154/99 mmHg Patient Gender: F               HR:           77 bpm. Exam Location:  Inpatient Procedure: 2D Echo Indications:    stroke  History:        Patient has no prior history of Echocardiogram examinations.                 Signs/Symptoms:Syncope.  Sonographer:    Johny Chess RDCS Referring Phys: [id number] [place]  1. Left ventricular ejection fraction, by estimation, is 60 to 65%. The left ventricle has normal function. The left ventricle has no regional wall motion abnormalities. There is mild concentric left ventricular hypertrophy. Left ventricular diastolic parameters were normal.  2. Right ventricular systolic function is normal. The right ventricular size is normal. There is normal pulmonary artery systolic pressure.  3. The mitral valve is normal in structure. Trivial mitral valve regurgitation. No evidence of mitral stenosis.  4. Tricuspid valve regurgitation is mild to moderate.  5.  The aortic valve is normal in structure. Aortic valve regurgitation is not visualized. Aortic valve sclerosis is present, with no evidence of aortic valve stenosis.  6. The inferior vena cava is normal in size with greater than 50% respiratory variability, suggesting right atrial pressure of 3 mmHg.  7. Agitated saline contrast bubble study was negative, with no evidence of any interatrial shunt. Comparison(s): No prior Echocardiogram. FINDINGS  Left Ventricle: Left ventricular ejection fraction, by estimation, is 60 to 65%. The left ventricle has normal function. The left ventricle has no regional wall motion abnormalities. The left ventricular internal cavity size was normal in size. There is  mild concentric left ventricular hypertrophy. Left ventricular diastolic parameters were normal. Right Ventricle: The right ventricular size is normal.  No increase in right ventricular wall thickness. Right ventricular systolic function is normal. There is normal pulmonary artery systolic pressure. The tricuspid regurgitant velocity is 1.82 m/s, and  with an assumed right atrial pressure of 3 mmHg, the estimated right ventricular systolic pressure is [id number] mmHg. Left Atrium: Left atrial size was normal in size. Right Atrium: Right atrial size was normal in size. Pericardium: There is no evidence of pericardial effusion. Mitral Valve: The mitral valve is normal in structure. Trivial mitral valve regurgitation. No evidence of mitral valve stenosis. Tricuspid Valve: The tricuspid valve is normal in structure. Tricuspid valve regurgitation is mild to moderate. No evidence of tricuspid stenosis. Aortic Valve: The aortic valve is normal in structure. Aortic valve regurgitation is not visualized. Aortic valve sclerosis is present, with no evidence of aortic valve stenosis. Pulmonic Valve: The pulmonic valve was normal in structure. Pulmonic valve regurgitation is trivial. No evidence of pulmonic stenosis. Aorta: The aortic root is normal in size and structure. Venous: The inferior vena cava is normal in size with greater than 50% respiratory variability, suggesting right atrial pressure of 3 mmHg. IAS/Shunts: No atrial level shunt detected by color flow Doppler. Agitated saline contrast was given intravenously to evaluate for intracardiac shunting. Agitated saline contrast bubble study was negative, with no evidence of any interatrial shunt.  LEFT VENTRICLE PLAX 2D LVIDd:         4.70 cm   Diastology LVIDs:         3.20 cm   LV e' medial:    9.90 cm/s LV PW:         1.20 cm   LV E/e' medial:  8.6 LV IVS:        1.30 cm   LV e' lateral:   11.90 cm/s LVOT diam:     2.10 cm   LV E/e' lateral: 7.2 LV SV:         75 LV SV Index:   30 LVOT Area:     3.46 cm  RIGHT VENTRICLE             IVC RV S prime:     11.70 cm/s  IVC diam: 2.00 cm LEFT ATRIUM             Index        RIGHT  ATRIUM           Index LA diam:        3.90 cm 1.57 cm/m   RA Area:     10.80 cm LA Vol (A2C):   65.4 ml 26.35 ml/m  RA Volume:   23.30 ml  9.39 ml/m LA Vol (A4C):   65.8 ml 26.51 ml/m LA Biplane Vol: 71.2 ml 28.69 ml/m  AORTIC  VALVE LVOT Vmax:   110.00 cm/s LVOT Vmean:  74.600 cm/s LVOT VTI:    0.217 m  AORTA Ao Root diam: 3.10 cm Ao Asc diam:  3.10 cm MITRAL VALVE               TRICUSPID VALVE MV Area (PHT): 3.48 cm    TR Peak grad:   13.2 mmHg MV Decel Time: 218 msec    TR Vmax:        182.00 cm/s MV E velocity: 85.20 cm/s MV A velocity: 54.10 cm/s  SHUNTS MV E/A ratio:  1.57        Systemic VTI:  0.22 m                            Systemic Diam: 2.10 cm Kardie Tobb DO Electronically signed by Berniece Salines DO Signature Date/Time: 04/07/2021/4:14:33 PM    Final    VAS [place] LOWER EXTREMITY VENOUS (DVT)  Result Date: 04/08/2021  Lower Venous DVT Study Patient Name:  TIRZA MAULER  Date of Exam:   04/08/2021 Medical Rec #: [id number]        Accession #:    [id number] Date of Birth: 1985/06/21        Patient Gender: F Patient Age:   61 years Exam Location:  [facility] Procedure:      VAS [place] LOWER EXTREMITY VENOUS (DVT) Referring Phys: PRAMOD SETHI --------------------------------------------------------------------------------  Indications: Stroke.  Comparison Study: No prior studies. Performing Technologist: Darlin Coco RDMS, RVT  Examination Guidelines: A complete evaluation includes B-mode imaging, spectral Doppler, color Doppler, and power Doppler as needed of all accessible portions of each vessel. Bilateral testing is considered an integral part of a complete examination. Limited examinations for reoccurring indications may be performed as noted. The reflux portion of the exam is performed with the patient in reverse Trendelenburg.  +---------+---------------+---------+-----------+----------+--------------+  RIGHT     Compressibility Phasicity Spontaneity Properties Thrombus Aging   +---------+---------------+---------+-----------+----------+--------------+  CFV       Full            Yes       Yes                                    +---------+---------------+---------+-----------+----------+--------------+  SFJ       Full                                                             +---------+---------------+---------+-----------+----------+--------------+  FV Prox   Full                                                             +---------+---------------+---------+-----------+----------+--------------+  FV Mid    Full                                                             +---------+---------------+---------+-----------+----------+--------------+  FV Distal Full                                                             +---------+---------------+---------+-----------+----------+--------------+  PFV       Full                                                             +---------+---------------+---------+-----------+----------+--------------+  POP       Full            Yes       Yes                                    +---------+---------------+---------+-----------+----------+--------------+  PTV       Full                                                             +---------+---------------+---------+-----------+----------+--------------+  PERO      Full                                                             +---------+---------------+---------+-----------+----------+--------------+  Gastroc   Full                                                             +---------+---------------+---------+-----------+----------+--------------+   +---------+---------------+---------+-----------+----------+--------------+  LEFT      Compressibility Phasicity Spontaneity Properties Thrombus Aging  +---------+---------------+---------+-----------+----------+--------------+  CFV       Full            Yes       Yes                                     +---------+---------------+---------+-----------+----------+--------------+  SFJ       Full                                                             +---------+---------------+---------+-----------+----------+--------------+  FV Prox   Full                                                             +---------+---------------+---------+-----------+----------+--------------+  FV Mid    Full                                                             +---------+---------------+---------+-----------+----------+--------------+  FV Distal Full                                                             +---------+---------------+---------+-----------+----------+--------------+  PFV       Full                                                             +---------+---------------+---------+-----------+----------+--------------+  POP       Full            Yes       Yes                                    +---------+---------------+---------+-----------+----------+--------------+  PTV       Full                                                             +---------+---------------+---------+-----------+----------+--------------+  PERO      Full                                                             +---------+---------------+---------+-----------+----------+--------------+  Gastroc   Full                                                             +---------+---------------+---------+-----------+----------+--------------+     Summary: RIGHT: - There is no evidence of deep vein thrombosis in the lower extremity.  - No cystic structure found in the popliteal fossa.  LEFT: - There is no evidence of deep vein thrombosis in the lower extremity.  - No cystic structure found in the popliteal fossa.  *See table(s) above for measurements and observations. Electronically signed by Orlie Pollen on 04/08/2021 at 5:46:14 PM.    Final     Microbiology: Results for orders placed or performed during the hospital encounter of  04/07/21  Resp Panel by RT-PCR (Flu A&B, Covid) Nasopharyngeal Swab     Status: None   Collection Time: 04/07/21  3:50 AM   Specimen: Nasopharyngeal Swab; Nasopharyngeal(NP) swabs in vial transport medium  Result Value Ref Range Status   SARS Coronavirus 2 by RT PCR NEGATIVE NEGATIVE Final    Comment: (NOTE) SARS-CoV-2 target nucleic acids are NOT DETECTED.  The SARS-CoV-2 RNA is generally detectable in upper respiratory specimens during the acute phase of infection. The lowest concentration of SARS-CoV-2 viral copies this assay can detect is 138 copies/mL. A negative result does not preclude SARS-Cov-2 infection and should not be used as the sole basis for treatment or other patient management decisions. A negative result may occur with  improper specimen collection/handling, submission of specimen other than nasopharyngeal swab, presence of viral mutation(s) within the areas targeted by this assay, and inadequate number of viral copies(<138 copies/mL). A negative result must be combined with clinical observations, patient history, and epidemiological information. The expected result is Negative.  Fact Sheet for Patients:  EntrepreneurPulse.com.au  Fact Sheet for Healthcare Providers:  IncredibleEmployment.be  This test is no t yet approved or cleared by the [place] FDA and  has been authorized for detection and/or diagnosis of SARS-CoV-2 by FDA under an Emergency Use Authorization (EUA). This EUA will remain  in effect (meaning this test can be used) for the duration of the COVID-19 declaration under Section 564(b)(1) of the Act, 21 U.S.C.section 360bbb-3(b)(1), unless the authorization is terminated  or revoked sooner.       Influenza A by PCR NEGATIVE NEGATIVE Final   Influenza B by PCR NEGATIVE NEGATIVE Final    Comment: (NOTE) The Xpert Xpress SARS-CoV-2/FLU/RSV plus assay is intended as an aid in the diagnosis of influenza from  Nasopharyngeal swab specimens and should not be used as a sole basis for treatment. Nasal washings and aspirates are unacceptable for Xpert Xpress SARS-CoV-2/FLU/RSV testing.  Fact Sheet for Patients: EntrepreneurPulse.com.au  Fact Sheet for Healthcare Providers: IncredibleEmployment.be  This test is not yet approved or cleared by the [place] FDA and has been authorized for detection and/or diagnosis of SARS-CoV-2 by FDA under an Emergency Use Authorization (EUA). This EUA will remain in effect (meaning this test can be used) for the duration of the COVID-19 declaration under Section 564(b)(1) of the Act, 21 U.S.C. section 360bbb-3(b)(1), unless the authorization is terminated or revoked.  Performed at [facility], [address], [place], [place] [address]   Surgical pcr screen     Status: None   Collection Time: 04/08/21 10:45 PM   Specimen: Nasal Mucosa; Nasal Swab  Result Value Ref Range Status   MRSA, PCR NEGATIVE NEGATIVE Final   Staphylococcus aureus NEGATIVE NEGATIVE Final    Comment: (NOTE) The Xpert SA Assay (FDA approved for NASAL specimens in patients 72 years of age and older), is one component of a comprehensive surveillance program. It is not intended to diagnose infection nor to guide or monitor treatment. Performed at [facility], [place] [address] [place], [place] [address]     Labs: CBC: Recent Labs  Lab 04/07/21 0234 04/09/21 0231  WBC 8.0 7.4  NEUTROABS 4.5 3.5  HGB 10.4* 10.0*  HCT 34.0* 31.5*  MCV 81.9 81.8  PLT 275 0000000   Basic Metabolic Panel: Recent Labs  Lab 04/07/21 0348 04/09/21 0231  NA 139 136  K 3.4* 3.6  CL 104 104  CO2 25 24  GLUCOSE 90 95  BUN 14 11  CREATININE 0.81 0.93  CALCIUM 8.6* 8.6*   Liver Function Tests: No results for input(s): AST, ALT, ALKPHOS, BILITOT, PROT, ALBUMIN in the last 168 hours. CBG: No results for input(s): GLUCAP in the  last  168 hours.  Discharge time spent: greater than 30 minutes.  Signed: Alma Friendly, MD [facility] 04/09/2021

## 2021-04-10 LAB — PROTEIN S, TOTAL: Protein S Ag, Total: 113 % (ref 60–150)

## 2021-04-10 LAB — LUPUS ANTICOAGULANT PANEL
DRVVT: 34.6 s (ref 0.0–47.0)
PTT Lupus Anticoagulant: 43.1 s (ref 0.0–51.9)

## 2021-04-10 LAB — BETA-2-GLYCOPROTEIN I ABS, IGG/M/A
Beta-2 Glyco I IgG: <9 GPI IgG units (ref 0–20)
Beta-2-Glycoprotein I IgA: 20 GPI IgA units (ref 0–25)
Beta-2-Glycoprotein I IgM: <9 GPI IgM units (ref 0–32)

## 2021-04-10 LAB — PROTEIN C ACTIVITY: Protein C Activity: 106 % (ref 73–180)

## 2021-04-10 LAB — PROTEIN S ACTIVITY: Protein S Activity: 76 % (ref 63–140)

## 2021-04-11 LAB — CARDIOLIPIN ANTIBODIES, IGG, IGM, IGA
Anticardiolipin IgA: <9 APL U/mL (ref 0–11)
Anticardiolipin IgG: <9 GPL U/mL (ref 0–14)
Anticardiolipin IgM: 14 MPL U/mL — ABNORMAL HIGH (ref 0–12)

## 2021-04-12 SURGERY — ECHOCARDIOGRAM, TRANSESOPHAGEAL
Anesthesia: Monitor Anesthesia Care

## 2021-04-13 LAB — FACTOR 5 LEIDEN

## 2021-04-13 LAB — PROTHROMBIN GENE MUTATION

## 2021-04-14 ENCOUNTER — Telehealth: Payer: Self-pay | Admitting: Internal Medicine

## 2021-04-14 NOTE — Telephone Encounter (Signed)
Returned call to patient who states that she was told in the hospital that she was supposed to see Dr. Debara Pickett and have a TEE done as part of her work up from her stroke- saw in her after visit summary that she was to see Dr. Debara Pickett and have TEE- do not see a referral placed and patient does not have a new patient appointment scheduled with a cardiologist. Patient states that neurology wanted her to be seen by cardiology- advised patient to reach out to neurology and see if this is still the plan and place a referral if needed. Advised patient that I would forward message to Dr. Debara Pickett for him to review and advise. Patient verbalized understanding.

## 2021-04-14 NOTE — Telephone Encounter (Signed)
Patient is calling to follow up on a TEE that is suppose to be schedule for her.  She was recently in the hospital.  The neurologist that wants to done is Dr. Delia Heady.

## 2021-04-16 NOTE — Telephone Encounter (Signed)
Scheduled for 04/19/21 @ 2:30pm with Dr. Wyline Mood

## 2021-04-16 NOTE — Telephone Encounter (Signed)
Chrystie Nose, MD  Bullins, Cleotilde Neer, RN 8 minutes ago (8:20 AM)   I'm not aware of this - she was supposed to have a TEE last Monday with Dr. Lorna Few, but it was cancelled. Can be scheduled for an office visit with any provider to discuss and get consent for TEE.   Dr. Rexene Edison     Left message to call back   OK to schedule on 04/19/21 with Dr. Wyline Mood (DOD) in an acute spot but appt type "new patient" -- notes hospital f/u, discuss scheduling TEE

## 2021-04-19 ENCOUNTER — Ambulatory Visit (INDEPENDENT_AMBULATORY_CARE_PROVIDER_SITE_OTHER): Payer: BC Managed Care – PPO | Admitting: Internal Medicine

## 2021-04-19 ENCOUNTER — Encounter: Payer: Self-pay | Admitting: Internal Medicine

## 2021-04-19 ENCOUNTER — Other Ambulatory Visit: Payer: Self-pay

## 2021-04-19 VITALS — BP 142/88 | HR 79 | Ht 68.0 in | Wt 325.0 lb

## 2021-04-19 DIAGNOSIS — I639 Cerebral infarction, unspecified: Secondary | ICD-10-CM

## 2021-04-19 NOTE — Addendum Note (Signed)
Addended byCarolan Clines on: 04/19/2021 03:40 PM   Modules accepted: Orders

## 2021-04-19 NOTE — Progress Notes (Signed)
Cardiology Office Note:    Date:  04/19/2021   ID:  Sara Buckley, DOB 1985/06/04, MRN [id number]  PCP:  Merryl Hacker, No   [facility] Providers Cardiologist:  Janina Mayo, MD     Referring MD: No ref. provider found   No chief complaint on file. Stroke  History of Present Illness:    Sara Buckley is a 36 y.o. female with a hx of presenting to the ED with acute left facial droop and numbness, MRI showed small right acute cortical ischemia,  no carotid stenosis, TTE with bubble study was negative, referral for TEE  Sara Buckley is here for referral for acute stroke. She went to the ED 04/07/2021 with acute left facial droop and numbness. She had a MRI that showed Solitary small 10 mm cortical area of abnormal diffusion and SWI in the right middle frontal gyrus. This is most suggestive of acute cortical ischemia. She had a TTE that showed hypermobile septum; however no bubbles were seen within 3-5 cardiac cycles. This study was not concerning for interatrial shunt. She was seen by neurology who are concerned for crytpogenic stroke and requested TEE. Hypercoag panel is negative. She was planned for TEE on Monday, It was not scheduled. Her brother had a pacemaker at age 66, it was removed when he was young. He was premature. Her mother had MI last year at age 75. She denies history of smoking, no drug use. She denies chest pain  or shortness of breath. No palpitations. TSH is normal. No diabetes. She noted COVID in November 2022. She has  a cousin who had COVID and had a stroke , she's in her late 4s or early 26s. She's on DAPT for 19 days. She is on atorvastatin 40 mg daily.  Cardiology Studies  TTE 04/07/2021 1. Left ventricular ejection fraction, by estimation, is 60 to 65%. The  left ventricle has normal function. The left ventricle has no regional  wall motion abnormalities. There is mild concentric left ventricular  hypertrophy. Left ventricular diastolic  parameters were normal.   2.  Right ventricular systolic function is normal. The right ventricular  size is normal. There is normal pulmonary artery systolic pressure.   3. The mitral valve is normal in structure. Trivial mitral valve  regurgitation. No evidence of mitral stenosis.   4. Tricuspid valve regurgitation is mild to moderate.   5. The aortic valve is normal in structure. Aortic valve regurgitation is  not visualized. Aortic valve sclerosis is present, with no evidence of  aortic valve stenosis.   6. The inferior vena cava is normal in size with greater than 50%  respiratory variability, suggesting right atrial pressure of 3 mmHg.   7. Agitated saline contrast bubble study was negative, with no evidence  of any interatrial shunt.    Past Medical History:  Diagnosis Date   Hypertension    Morbid obesity with BMI of 45.0-49.9, adult (HCC)    s/p weight loss surgery with 80 pound net weight loss    Past Surgical History:  Procedure Laterality Date   GASTRIC BYPASS      Current Medications: Current Meds  Medication Sig   aspirin EC 81 MG EC tablet Take 1 tablet (81 mg total) by mouth daily. Swallow whole.   atorvastatin (LIPITOR) 40 MG tablet Take 1 tablet (40 mg total) by mouth daily.   Calcium Carbonate-Vitamin D (OYSTER SHELL CALCIUM/D) 500-5 MG-MCG TABS Take 1 tablet by mouth daily.   cholecalciferol (VITAMIN D3) 25 MCG (1000  UNIT) tablet Take 1,000 Units by mouth daily.   clopidogrel (PLAVIX) 75 MG tablet Take 1 tablet (75 mg total) by mouth daily for 19 days.   EPINEPHrine 0.3 mg/0.3 mL IJ SOAJ injection Inject 0.3 mg into the muscle as needed for anaphylaxis.   ferrous sulfate 325 (65 FE) MG tablet Take 325 mg by mouth daily with breakfast.   Multiple Vitamin (MULTI-VITAMIN) tablet Take 1 tablet by mouth daily.     Allergies:   Fluticasone-salmeterol, Peanut oil, and Other   Social History   Socioeconomic History   Marital status: Significant Other    Spouse name: Not on file   Number of  children: Not on file   Years of education: Not on file   Highest education level: Not on file  Occupational History   Not on file  Tobacco Use   Smoking status: Never   Smokeless tobacco: Never  Vaping Use   Vaping Use: Never used  Substance and Sexual Activity   Alcohol use: Not Currently   Drug use: Not Currently   Sexual activity: Not on file  Other Topics Concern   Not on file  Social History Narrative   Not on file   Social Determinants of Health   Financial Resource Strain: Not on file  Food Insecurity: Not on file  Transportation Needs: Not on file  Physical Activity: Not on file  Stress: Not on file  Social Connections: Not on file     Family History: The patient's family history includes Stroke in her cousin, maternal grandfather, and maternal grandmother.  ROS:   Please see the history of present illness.     All other systems reviewed and are negative.  EKGs/Labs/Other Studies Reviewed:    The following studies were reviewed today:   EKG:  EKG is  ordered today.  The ekg ordered today demonstrates   NSR  Recent Labs: 04/08/2021: TSH 3.129 04/09/2021: BUN 11; Creatinine, Ser 0.93; Hemoglobin 10.0; Platelets 243; Potassium 3.6; Sodium 136   Recent Lipid Panel    Component Value Date/Time   CHOL 167 04/08/2021 0223   TRIG 50 04/08/2021 0223   HDL 44 04/08/2021 0223   CHOLHDL 3.8 04/08/2021 0223   VLDL 10 04/08/2021 0223   LDLCALC 113 (H) 04/08/2021 0223     Risk Assessment/Calculations:           Physical Exam:    VS:    Vitals:   04/19/21 1430  BP: (!) 142/88  Pulse: 79  SpO2: 99%     Wt Readings from Last 3 Encounters:  04/19/21 (!) 325 lb (147.4 kg)  04/06/21 (!) 315 lb 14.7 oz (143.3 kg)  07/27/20 (!) 316 lb (143.3 kg)     GEN:  Well nourished, well developed in no acute distress HEENT: Normal NECK: No JVD; No carotid bruits LYMPHATICS: No lymphadenopathy CARDIAC: RRR, no murmurs, rubs, gallops RESPIRATORY:  Clear to  auscultation without rales, wheezing or rhonchi  ABDOMEN: Soft, non-tender, non-distended MUSCULOSKELETAL:  No edema; No deformity  SKIN: Warm and dry NEUROLOGIC:  Alert and oriented x 3 PSYCHIATRIC:  Normal affect   ASSESSMENT:    #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 Considering the concern for cryptogenic stroke and request from neurology, will plan for TEE. She has no hx of afib but can include assessment of the LAA.  PLAN:    In order of problems listed above:  TEE with bubble  study for cryptogenic stroke Follow up as needed (pending results)      Medication Adjustments/Labs and Tests Ordered: Current medicines are reviewed at length with the patient today.  Concerns regarding medicines are outlined above.  Orders Placed This Encounter  Procedures   Basic metabolic panel   CBC   EKG 12-Lead   No orders of the defined types were placed in this encounter.   Patient Instructions  Medication Instructions:  No Changes In Medications at this time.  *If you need a refill on your cardiac medications before your next appointment, please call your pharmacy*  Lab Work: Please return for Blood Work ON 05/03/21. No appointment needed, lab here at the office is open Monday-Friday from 8AM to 4PM and closed daily for lunch from 12:45-1:45.   If you have labs (blood work) drawn today and your tests are completely normal, you will receive your results only by: [place] (if you have MyChart) OR A paper copy in the mail If you have any lab test that is abnormal or we need to change your treatment, we will call you to review the results.  Testing/Procedures: Your physician has requested that you have a TEE. During a TEE, sound waves are used to create images of your heart. It provides your doctor with information about the size and shape of your heart and how well your hearts  chambers and valves are working. In this test, a transducer is attached to the end of a flexible tube thats guided down your throat and into your esophagus (the tube leading from you mouth to your stomach) to get a more detailed image of your heart. You are not awake for the procedure. Please see the instruction sheet given to you today. For further information please visit [email or web address].  Follow-Up: At [facility], you and your health needs are our priority.  As part of our continuing mission to provide you with exceptional heart care, we have created designated Provider Care Teams.  These Care Teams include your primary Cardiologist (physician) and Advanced Practice Providers (APPs -  Physician Assistants and Nurse Practitioners) who all work together to provide you with the care you need, when you need it.  Your next appointment:   AS NEEDED   The format for your next appointment:   In Person  Provider:   Janina Mayo, MD    Other Instructions Dear Ms. Sara Buckley are scheduled for a TEE on 05/06/21 with Dr. Johney Frame.  Please arrive at the [facility] (Main Entrance A) at [facility]: [address] [place], [place] [address] at 10 am. (1 hour prior to procedure unless lab work is needed; if lab work is needed arrive 1.5 hours ahead)  DIET: Nothing to eat or drink after midnight except a sip of water with medications (see medication instructions below)  FYI: For your safety, and to allow [place] to monitor your vital signs accurately during the surgery/procedure we request that   if you have artificial nails, gel coating, SNS etc. Please have those removed prior to your surgery/procedure. Not having the nail coverings /polish removed may result in cancellation or delay of your surgery/procedure.  Labs: If patient is on Coumadin, patient needs pt/INR, CBC, BMET within 3 days (No pt/INR needed for patients taking Xarelto, Eliquis, Pradaxa) For patients receiving anesthesia for  TEE and all Cardioversion patients: BMET, CBC within 1 week  You must have a responsible person to drive you home and stay in the waiting area  during your procedure. Failure to do so could result in cancellation.  Bring your insurance cards.  *Special Note: Every effort is made to have your procedure done on time. Occasionally there are emergencies that occur at the hospital that may cause delays. Please be patient if a delay does occur.     Signed, Janina Mayo, MD  04/19/2021 3:36 PM    [facility]

## 2021-04-19 NOTE — Patient Instructions (Addendum)
Medication Instructions:  No Changes In Medications at this time.  *If you need a refill on your cardiac medications before your next appointment, please call your pharmacy*  Lab Work: Please return for Blood Work ON 05/03/21. No appointment needed, lab here at the office is open Monday-Friday from 8AM to 4PM and closed daily for lunch from 12:45-1:45.   If you have labs (blood work) drawn today and your tests are completely normal, you will receive your results only by: MyChart Message (if you have MyChart) OR A paper copy in the mail If you have any lab test that is abnormal or we need to change your treatment, we will call you to review the results.  Testing/Procedures: Your physician has requested that you have a TEE. During a TEE, sound waves are used to create images of your heart. It provides your doctor with information about the size and shape of your heart and how well your hearts chambers and valves are working. In this test, a transducer is attached to the end of a flexible tube thats guided down your throat and into your esophagus (the tube leading from you mouth to your stomach) to get a more detailed image of your heart. You are not awake for the procedure. Please see the instruction sheet given to you today. For further information please visit [email or web address]  Follow-Up: At [facility], you and your health needs are our priority.  As part of our continuing mission to provide you with exceptional heart care, we have created designated Provider Care Teams.  These Care Teams include your primary Cardiologist (physician) and Advanced Practice Providers (APPs -  Physician Assistants and Nurse Practitioners) who all work together to provide you with the care you need, when you need it.  Your next appointment:   AS NEEDED   The format for your next appointment:   In Person  Provider:   Maisie Fus, MD    Other Instructions Dear Ms. Sara Buckley are scheduled for a TEE  on 05/06/21 with Dr. Shari Prows.  Please arrive at the [facility] (Main Entrance A) at [facility]: [address] [place], [place] [address] at 10 am. (1 hour prior to procedure unless lab work is needed; if lab work is needed arrive 1.5 hours ahead)  DIET: Nothing to eat or drink after midnight except a sip of water with medications (see medication instructions below)  FYI: For your safety, and to allow [place] to monitor your vital signs accurately during the surgery/procedure we request that   if you have artificial nails, gel coating, SNS etc. Please have those removed prior to your surgery/procedure. Not having the nail coverings /polish removed may result in cancellation or delay of your surgery/procedure.  Labs: If patient is on Coumadin, patient needs pt/INR, CBC, BMET within 3 days (No pt/INR needed for patients taking Xarelto, Eliquis, Pradaxa) For patients receiving anesthesia for TEE and all Cardioversion patients: BMET, CBC within 1 week  You must have a responsible person to drive you home and stay in the waiting area during your procedure. Failure to do so could result in cancellation.  Bring your insurance cards.  *Special Note: Every effort is made to have your procedure done on time. Occasionally there are emergencies that occur at the hospital that may cause delays. Please be patient if a delay does occur.

## 2021-04-19 NOTE — H&P (View-Only) (Signed)
Cardiology Office Note:    Date:  04/19/2021   ID:  Vadis Perfect, DOB 1985-06-01, MRN [id number]  PCP:  Merryl Hacker, No   [facility] Providers Cardiologist:  Janina Mayo, MD     Referring MD: No ref. provider found   No chief complaint on file. Stroke  History of Present Illness:    Sara Buckley is a 36 y.o. female with a hx of presenting to the ED with acute left facial droop and numbness, MRI showed small right acute cortical ischemia,  no carotid stenosis, TTE with bubble study was negative, referral for TEE  Sara Buckley is here for referral for acute stroke. She went to the ED 04/07/2021 with acute left facial droop and numbness. She had a MRI that showed Solitary small 10 mm cortical area of abnormal diffusion and SWI in the right middle frontal gyrus. This is most suggestive of acute cortical ischemia. She had a TTE that showed hypermobile septum; however no bubbles were seen within 3-5 cardiac cycles. This study was not concerning for interatrial shunt. She was seen by neurology who are concerned for crytpogenic stroke and requested TEE. Hypercoag panel is negative. She was planned for TEE on Monday, It was not scheduled. Her brother had a pacemaker at age 14, it was removed when he was young. He was premature. Her mother had MI last year at age 43. She denies history of smoking, no drug use. She denies chest pain  or shortness of breath. No palpitations. TSH is normal. No diabetes. She noted COVID in November 2022. She has  a cousin who had COVID and had a stroke , she's in her late 38s or early 66s. She's on DAPT for 19 days. She is on atorvastatin 40 mg daily.  Cardiology Studies  TTE 04/07/2021 1. Left ventricular ejection fraction, by estimation, is 60 to 65%. The  left ventricle has normal function. The left ventricle has no regional  wall motion abnormalities. There is mild concentric left ventricular  hypertrophy. Left ventricular diastolic  parameters were normal.   2.  Right ventricular systolic function is normal. The right ventricular  size is normal. There is normal pulmonary artery systolic pressure.   3. The mitral valve is normal in structure. Trivial mitral valve  regurgitation. No evidence of mitral stenosis.   4. Tricuspid valve regurgitation is mild to moderate.   5. The aortic valve is normal in structure. Aortic valve regurgitation is  not visualized. Aortic valve sclerosis is present, with no evidence of  aortic valve stenosis.   6. The inferior vena cava is normal in size with greater than 50%  respiratory variability, suggesting right atrial pressure of 3 mmHg.   7. Agitated saline contrast bubble study was negative, with no evidence  of any interatrial shunt.    Past Medical History:  Diagnosis Date   Hypertension    Morbid obesity with BMI of 45.0-49.9, adult (HCC)    s/p weight loss surgery with 80 pound net weight loss    Past Surgical History:  Procedure Laterality Date   GASTRIC BYPASS      Current Medications: Current Meds  Medication Sig   aspirin EC 81 MG EC tablet Take 1 tablet (81 mg total) by mouth daily. Swallow whole.   atorvastatin (LIPITOR) 40 MG tablet Take 1 tablet (40 mg total) by mouth daily.   Calcium Carbonate-Vitamin D (OYSTER SHELL CALCIUM/D) 500-5 MG-MCG TABS Take 1 tablet by mouth daily.   cholecalciferol (VITAMIN D3) 25 MCG (1000  UNIT) tablet Take 1,000 Units by mouth daily.   clopidogrel (PLAVIX) 75 MG tablet Take 1 tablet (75 mg total) by mouth daily for 19 days.   EPINEPHrine 0.3 mg/0.3 mL IJ SOAJ injection Inject 0.3 mg into the muscle as needed for anaphylaxis.   ferrous sulfate 325 (65 FE) MG tablet Take 325 mg by mouth daily with breakfast.   Multiple Vitamin (MULTI-VITAMIN) tablet Take 1 tablet by mouth daily.     Allergies:   Fluticasone-salmeterol, Peanut oil, and Other   Social History   Socioeconomic History   Marital status: Significant Other    Spouse name: Not on file   Number of  children: Not on file   Years of education: Not on file   Highest education level: Not on file  Occupational History   Not on file  Tobacco Use   Smoking status: Never   Smokeless tobacco: Never  Vaping Use   Vaping Use: Never used  Substance and Sexual Activity   Alcohol use: Not Currently   Drug use: Not Currently   Sexual activity: Not on file  Other Topics Concern   Not on file  Social History Narrative   Not on file   Social Determinants of Health   Financial Resource Strain: Not on file  Food Insecurity: Not on file  Transportation Needs: Not on file  Physical Activity: Not on file  Stress: Not on file  Social Connections: Not on file     Family History: The patient's family history includes Stroke in her cousin, maternal grandfather, and maternal grandmother.  ROS:   Please see the history of present illness.     All other systems reviewed and are negative.  EKGs/Labs/Other Studies Reviewed:    The following studies were reviewed today:   EKG:  EKG is  ordered today.  The ekg ordered today demonstrates   NSR  Recent Labs: 04/08/2021: TSH 3.129 04/09/2021: BUN 11; Creatinine, Ser 0.93; Hemoglobin 10.0; Platelets 243; Potassium 3.6; Sodium 136   Recent Lipid Panel    Component Value Date/Time   CHOL 167 04/08/2021 0223   TRIG 50 04/08/2021 0223   HDL 44 04/08/2021 0223   CHOLHDL 3.8 04/08/2021 0223   VLDL 10 04/08/2021 0223   LDLCALC 113 (H) 04/08/2021 0223     Risk Assessment/Calculations:           Physical Exam:    VS:    Vitals:   04/19/21 1430  BP: (!) 142/88  Pulse: 79  SpO2: 99%     Wt Readings from Last 3 Encounters:  04/19/21 (!) 325 lb (147.4 kg)  04/06/21 (!) 315 lb 14.7 oz (143.3 kg)  07/27/20 (!) 316 lb (143.3 kg)     GEN:  Well nourished, well developed in no acute distress HEENT: Normal NECK: No JVD; No carotid bruits LYMPHATICS: No lymphadenopathy CARDIAC: RRR, no murmurs, rubs, gallops RESPIRATORY:  Clear to  auscultation without rales, wheezing or rhonchi  ABDOMEN: Soft, non-tender, non-distended MUSCULOSKELETAL:  No edema; No deformity  SKIN: Warm and dry NEUROLOGIC:  Alert and oriented x 3 PSYCHIATRIC:  Normal affect   ASSESSMENT:    #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 Considering the concern for cryptogenic stroke and request from neurology, will plan for TEE. She has no hx of afib but can include assessment of the LAA.  PLAN:    In order of problems listed above:  TEE with bubble  study for cryptogenic stroke Follow up as needed (pending results)      Medication Adjustments/Labs and Tests Ordered: Current medicines are reviewed at length with the patient today.  Concerns regarding medicines are outlined above.  Orders Placed This Encounter  Procedures   Basic metabolic panel   CBC   EKG 12-Lead   No orders of the defined types were placed in this encounter.   Patient Instructions  Medication Instructions:  No Changes In Medications at this time.  *If you need a refill on your cardiac medications before your next appointment, please call your pharmacy*  Lab Work: Please return for Blood Work ON 05/03/21. No appointment needed, lab here at the office is open Monday-Friday from 8AM to 4PM and closed daily for lunch from 12:45-1:45.   If you have labs (blood work) drawn today and your tests are completely normal, you will receive your results only by: [place] (if you have MyChart) OR A paper copy in the mail If you have any lab test that is abnormal or we need to change your treatment, we will call you to review the results.  Testing/Procedures: Your physician has requested that you have a TEE. During a TEE, sound waves are used to create images of your heart. It provides your doctor with information about the size and shape of your heart and how well your hearts  chambers and valves are working. In this test, a transducer is attached to the end of a flexible tube thats guided down your throat and into your esophagus (the tube leading from you mouth to your stomach) to get a more detailed image of your heart. You are not awake for the procedure. Please see the instruction sheet given to you today. For further information please visit [email or web address].  Follow-Up: At [facility], you and your health needs are our priority.  As part of our continuing mission to provide you with exceptional heart care, we have created designated Provider Care Teams.  These Care Teams include your primary Cardiologist (physician) and Advanced Practice Providers (APPs -  Physician Assistants and Nurse Practitioners) who all work together to provide you with the care you need, when you need it.  Your next appointment:   AS NEEDED   The format for your next appointment:   In Person  Provider:   Janina Mayo, MD    Other Instructions Dear Ms. Larona Sheffler are scheduled for a TEE on 05/06/21 with Dr. Johney Frame.  Please arrive at the [facility] (Main Entrance A) at [facility]: [address] [place], [place] [address] at 10 am. (1 hour prior to procedure unless lab work is needed; if lab work is needed arrive 1.5 hours ahead)  DIET: Nothing to eat or drink after midnight except a sip of water with medications (see medication instructions below)  FYI: For your safety, and to allow [place] to monitor your vital signs accurately during the surgery/procedure we request that   if you have artificial nails, gel coating, SNS etc. Please have those removed prior to your surgery/procedure. Not having the nail coverings /polish removed may result in cancellation or delay of your surgery/procedure.  Labs: If patient is on Coumadin, patient needs pt/INR, CBC, BMET within 3 days (No pt/INR needed for patients taking Xarelto, Eliquis, Pradaxa) For patients receiving anesthesia for  TEE and all Cardioversion patients: BMET, CBC within 1 week  You must have a responsible person to drive you home and stay in the waiting area  during your procedure. Failure to do so could result in cancellation.  Bring your insurance cards.  *Special Note: Every effort is made to have your procedure done on time. Occasionally there are emergencies that occur at the hospital that may cause delays. Please be patient if a delay does occur.     Signed, Janina Mayo, MD  04/19/2021 3:36 PM    [facility]

## 2021-04-28 ENCOUNTER — Encounter (HOSPITAL_COMMUNITY): Payer: Self-pay | Admitting: Cardiology

## 2021-04-28 NOTE — Progress Notes (Signed)
Attempted to obtain medical history via telephone, unable to reach at this time. I left a voicemail to return pre surgical testing department's phone call.  

## 2021-05-03 LAB — CBC
Hematocrit: 36.2 % (ref 34.0–46.6)
Hemoglobin: 11.2 g/dL (ref 11.1–15.9)
MCH: 25.3 pg — ABNORMAL LOW (ref 26.6–33.0)
MCHC: 30.9 g/dL — ABNORMAL LOW (ref 31.5–35.7)
MCV: 82 fL (ref 79–97)
Platelets: 273 10*3/uL (ref 150–450)
RBC: 4.43 x10E6/uL (ref 3.77–5.28)
RDW: 13.9 % (ref 11.7–15.4)
WBC: 7.4 10*3/uL (ref 3.4–10.8)

## 2021-05-03 LAB — BASIC METABOLIC PANEL
BUN/Creatinine Ratio: 17 (ref 9–23)
BUN: 15 mg/dL (ref 6–20)
CO2: 27 mmol/L (ref 20–29)
Calcium: 9.3 mg/dL (ref 8.7–10.2)
Chloride: 102 mmol/L (ref 96–106)
Creatinine, Ser: 0.9 mg/dL (ref 0.57–1.00)
Glucose: 69 mg/dL — ABNORMAL LOW (ref 70–99)
Potassium: 4 mmol/L (ref 3.5–5.2)
Sodium: 139 mmol/L (ref 134–144)
eGFR: 86 mL/min/{1.73_m2} (ref >59)

## 2021-05-06 ENCOUNTER — Ambulatory Visit (HOSPITAL_COMMUNITY): Payer: BC Managed Care – PPO | Admitting: Certified Registered Nurse Anesthetist

## 2021-05-06 ENCOUNTER — Encounter (HOSPITAL_COMMUNITY): Payer: Self-pay | Admitting: Cardiology

## 2021-05-06 ENCOUNTER — Ambulatory Visit (HOSPITAL_BASED_OUTPATIENT_CLINIC_OR_DEPARTMENT_OTHER): Payer: BC Managed Care – PPO

## 2021-05-06 ENCOUNTER — Ambulatory Visit (HOSPITAL_COMMUNITY)
Admission: RE | Admit: 2021-05-06 | Discharge: 2021-05-06 | Disposition: A | Discharge status: Home / Self Care | Payer: BC Managed Care – PPO | Attending: Cardiology | Admitting: Cardiology

## 2021-05-06 ENCOUNTER — Other Ambulatory Visit: Payer: Self-pay

## 2021-05-06 ENCOUNTER — Encounter (HOSPITAL_COMMUNITY)
Admission: RE | Disposition: A | Discharge status: Home / Self Care | Payer: Self-pay | Source: Home / Self Care | Attending: Cardiology

## 2021-05-06 DIAGNOSIS — I639 Cerebral infarction, unspecified: Secondary | ICD-10-CM | POA: Diagnosis not present

## 2021-05-06 DIAGNOSIS — Z79899 Other long term (current) drug therapy: Secondary | ICD-10-CM | POA: Diagnosis not present

## 2021-05-06 DIAGNOSIS — Z6841 Body Mass Index (BMI) 40.0 and over, adult: Secondary | ICD-10-CM | POA: Diagnosis not present

## 2021-05-06 DIAGNOSIS — Z8249 Family history of ischemic heart disease and other diseases of the circulatory system: Secondary | ICD-10-CM | POA: Insufficient documentation

## 2021-05-06 DIAGNOSIS — I10 Essential (primary) hypertension: Secondary | ICD-10-CM | POA: Diagnosis not present

## 2021-05-06 DIAGNOSIS — Z8616 Personal history of COVID-19: Secondary | ICD-10-CM | POA: Diagnosis not present

## 2021-05-06 HISTORY — PX: TEE WITHOUT CARDIOVERSION: SHX5443

## 2021-05-06 SURGERY — ECHOCARDIOGRAM, TRANSESOPHAGEAL
Anesthesia: Monitor Anesthesia Care

## 2021-05-06 MED ORDER — SODIUM CHLORIDE 0.9 % IV SOLN: INTRAVENOUS | Status: DC

## 2021-05-06 MED ORDER — PROPOFOL 10 MG/ML IV BOLUS
INTRAVENOUS | Status: DC | PRN
  Administered 2021-05-06 (×2): 30 mg via INTRAVENOUS
  Administered 2021-05-06: 20 mg via INTRAVENOUS
  Administered 2021-05-06: 30 mg via INTRAVENOUS
  Administered 2021-05-06 (×2): 20 mg via INTRAVENOUS

## 2021-05-06 MED ORDER — PROPOFOL 500 MG/50ML IV EMUL
INTRAVENOUS | Status: DC | PRN
  Administered 2021-05-06: 100 ug/kg/min via INTRAVENOUS
  Administered 2021-05-06: 175 ug/kg/min via INTRAVENOUS

## 2021-05-06 NOTE — Discharge Instructions (Signed)

## 2021-05-06 NOTE — Anesthesia Procedure Notes (Signed)
Procedure Name: [place] ?Date/Time: 05/06/2021 10:33 AM ?Performed by: Carolan Clines, CRNA ?Pre-anesthesia Checklist: Patient identified, Emergency Drugs available, Suction available, Patient being monitored and Timeout performed ?Patient Re-evaluated:Patient Re-evaluated prior to induction ?Oxygen Delivery Method: Nasal cannula ?Dental Injury: Teeth and Oropharynx as per pre-operative assessment  ? ? ? ? ?

## 2021-05-06 NOTE — Progress Notes (Signed)
?  Echocardiogram ?Echocardiogram Transesophageal has been performed. ? Jarome Matin ?05/06/2021, 11:36 AM ?

## 2021-05-06 NOTE — Anesthesia Postprocedure Evaluation (Signed)
Anesthesia Post Note ? ?Patient: ETTAMAE BARKETT ? ?Procedure(s) Performed: TRANSESOPHAGEAL ECHOCARDIOGRAM (TEE) ? ?  ? ?Patient location during evaluation: Endoscopy ?Anesthesia Type: MAC ?Level of consciousness: awake and alert ?Pain management: pain level controlled ?Vital Signs Assessment: post-procedure vital signs reviewed and stable ?Respiratory status: spontaneous breathing, nonlabored ventilation and respiratory function stable ?Cardiovascular status: stable and blood pressure returned to baseline ?Postop Assessment: no apparent nausea or vomiting ?Anesthetic complications: no ? ? ?No notable events documented. ? ?Last Vitals:  ?Vitals:  ? 05/06/21 1112 05/06/21 1130  ?BP: (!) 101/53 121/83  ?Pulse: 81 69  ?Resp: 14 15  ?Temp:    ?SpO2: 98% 96%  ?  ?Last Pain:  ?Vitals:  ? 05/06/21 1130  ?TempSrc:   ?PainSc: 0-No pain  ? ? ?  ?  ?  ?  ?  ?  ? ?Kareem Cathey,W. EDMOND ? ? ? ? ?

## 2021-05-06 NOTE — Interval H&P Note (Signed)
History and Physical Interval Note: ? ?05/06/2021 ?9:28 AM ? ?Sara Buckley  has presented today for surgery, with the diagnosis of STROKE.  The various methods of treatment have been discussed with the patient and family. After consideration of risks, benefits and other options for treatment, the patient has consented to  Procedure(s): ?TRANSESOPHAGEAL ECHOCARDIOGRAM (TEE) (N/A) as a surgical intervention.  The patient's history has been reviewed, patient examined, no change in status, stable for surgery.  I have reviewed the patient's chart and labs.  Questions were answered to the patient's satisfaction.   ? ? ?Meriam Sprague ? ? ?

## 2021-05-06 NOTE — Anesthesia Preprocedure Evaluation (Signed)
Anesthesia Evaluation  ?Patient identified by MRN, date of birth, ID band ?Patient awake ? ? ? ?Reviewed: ?Allergy & Precautions, H&P , NPO status , Patient's Chart, lab work & pertinent test results ? ?Airway ?Mallampati: III ? ?TM Distance: >3 FB ?Neck ROM: Full ? ? ? Dental ?no notable dental hx. ?(+) Teeth Intact, Dental Advisory Given ?  ?Pulmonary ?neg pulmonary ROS,  ?  ?Pulmonary exam normal ?breath sounds clear to auscultation ? ? ? ? ? ? Cardiovascular ?hypertension, Pt. on medications ? ?Rhythm:Regular Rate:Normal ? ? ?  ?Neuro/Psych ?CVA, No Residual Symptoms negative psych ROS  ? GI/Hepatic ?negative GI ROS, Neg liver ROS,   ?Endo/Other  ?Morbid obesity ? Renal/GU ?negative Renal ROS  ?negative genitourinary ?  ?Musculoskeletal ? ? Abdominal ?  ?Peds ? Hematology ?negative hematology ROS ?(+)   ?Anesthesia Other Findings ? ? Reproductive/Obstetrics ?negative OB ROS ? ?  ? ? ? ? ? ? ? ? ? ? ? ? ? ?  ?  ? ? ? ? ? ? ? ? ?Anesthesia Physical ?Anesthesia Plan ? ?ASA: 3 ? ?Anesthesia Plan: MAC  ? ?Post-op Pain Management: Minimal or no pain anticipated  ? ?Induction: Intravenous ? ?PONV Risk Score and Plan: 2 and Propofol infusion and Treatment may vary due to age or medical condition ? ?Airway Management Planned: Nasal Cannula and Natural Airway ? ?Additional Equipment:  ? ?Intra-op Plan:  ? ?Post-operative Plan:  ? ?Informed Consent: I have reviewed the patients History and Physical, chart, labs and discussed the procedure including the risks, benefits and alternatives for the proposed anesthesia with the patient or authorized representative who has indicated his/her understanding and acceptance.  ? ? ? ?Dental advisory given ? ?Plan Discussed with: CRNA ? ?Anesthesia Plan Comments:   ? ? ? ? ? ? ?Anesthesia Quick Evaluation ? ?

## 2021-05-06 NOTE — CV Procedure (Signed)
? ? ? ?  Transesophageal Echocardiogram Note ? ?Sara Buckley ?[id number] ?[birth year] ? ?Procedure: Transesophageal Echocardiogram ?Indications: Cryptogenic stroke ? ?Procedure Details ?Consent: Obtained ?Time Out: Verified patient identification, verified procedure, site/side was marked, verified correct patient position, special equipment/implants available, Radiology Safety Procedures followed,  medications/allergies/relevent history reviewed, required imaging and test results available.  Performed ? ?Medications: ?Propofol 700mg  ? ?Left Ventrical:  LVEF 60-65% ? ?Mitral Valve: Normal structure, trivial MR ? ?Aortic Valve: Tricuspid, no AI ? ?Tricuspid Valve: Normal structure, trivial TR ? ?Pulmonic Valve: Normal structure, trivial PI ? ?Left Atrium/ Left atrial appendage: No evidence of LAA thrombus ? ?Atrial septum: No PFO seen with color doppler or agitated saline at rest or with valsalva ? ?Aorta: No plaquing ? ? ?Complications: No apparent complications ?Patient did tolerate procedure well. ? ?Freada Bergeron, MD ?05/06/2021, 11:00 AM  ?

## 2021-05-06 NOTE — Transfer of Care (Signed)
Immediate Anesthesia Transfer of Care Note ? ?Patient: Sara Buckley ? ?Procedure(s) Performed: TRANSESOPHAGEAL ECHOCARDIOGRAM (TEE) ? ?Patient Location: Endoscopy Unit ? ?Anesthesia Type:MAC ? ?Level of Consciousness: awake, alert  and oriented ? ?Airway & Oxygen Therapy: Patient Spontanous Breathing ? ?Post-op Assessment: Report given to RN and Post -op Vital signs reviewed and stable ? ?Post vital signs: Reviewed and stable ? ?Last Vitals:  ?Vitals Value Taken Time  ?BP 101/53 05/06/21 1109  ?Temp 36.3 ?C 05/06/21 1109  ?Pulse 92 05/06/21 1112  ?Resp 15 05/06/21 1112  ?SpO2 97 % 05/06/21 1112  ?Vitals shown include unvalidated device data. ? ?Last Pain:  ?Vitals:  ? 05/06/21 1109  ?TempSrc: Temporal  ?PainSc: 0-No pain  ?   ? ?  ? ?Complications: No notable events documented. ?

## 2021-05-09 ENCOUNTER — Encounter (HOSPITAL_COMMUNITY): Payer: Self-pay | Admitting: Cardiology

## 2021-07-10 ENCOUNTER — Other Ambulatory Visit: Payer: Self-pay

## 2021-07-10 ENCOUNTER — Emergency Department (HOSPITAL_BASED_OUTPATIENT_CLINIC_OR_DEPARTMENT_OTHER)
Admission: EM | Admit: 2021-07-10 | Discharge: 2021-07-10 | Disposition: A | Discharge status: Home / Self Care | Payer: BC Managed Care – PPO | Attending: Emergency Medicine | Admitting: Emergency Medicine

## 2021-07-10 DIAGNOSIS — I10 Essential (primary) hypertension: Secondary | ICD-10-CM | POA: Diagnosis not present

## 2021-07-10 DIAGNOSIS — Z7902 Long term (current) use of antithrombotics/antiplatelets: Secondary | ICD-10-CM | POA: Insufficient documentation

## 2021-07-10 DIAGNOSIS — Z79899 Other long term (current) drug therapy: Secondary | ICD-10-CM | POA: Diagnosis not present

## 2021-07-10 DIAGNOSIS — Z91010 Allergy to peanuts: Secondary | ICD-10-CM | POA: Diagnosis not present

## 2021-07-10 DIAGNOSIS — M5441 Lumbago with sciatica, right side: Secondary | ICD-10-CM | POA: Diagnosis not present

## 2021-07-10 DIAGNOSIS — M545 Low back pain, unspecified: Secondary | ICD-10-CM | POA: Diagnosis present

## 2021-07-10 MED ORDER — IBUPROFEN 400 MG PO TABS
600.0000 mg | ORAL_TABLET | Freq: Once | ORAL | Status: AC
  Administered 2021-07-10: 600 mg via ORAL
  Filled 2021-07-10: qty 1

## 2021-07-10 MED ORDER — PREDNISONE 20 MG PO TABS
40.0000 mg | ORAL_TABLET | Freq: Once | ORAL | Status: AC
Start: 2021-07-10 — End: 2021-07-10
  Administered 2021-07-10: 40 mg via ORAL
  Filled 2021-07-10: qty 2

## 2021-07-10 MED ORDER — CYCLOBENZAPRINE HCL 10 MG PO TABS
10.0000 mg | ORAL_TABLET | Freq: Once | ORAL | Status: AC
  Administered 2021-07-10: 10 mg via ORAL
  Filled 2021-07-10: qty 1

## 2021-07-10 MED ORDER — PREDNISONE 10 MG PO TABS: 20.0000 mg | ORAL_TABLET | Freq: Every day | ORAL | 0 refills | Status: AC

## 2021-07-10 MED ORDER — LIDOCAINE 5 % EX PTCH
1.0000 | MEDICATED_PATCH | CUTANEOUS | Status: DC
  Administered 2021-07-10: 1 via TRANSDERMAL
  Filled 2021-07-10: qty 1

## 2021-07-10 MED ORDER — CYCLOBENZAPRINE HCL 10 MG PO TABS: 10.0000 mg | ORAL_TABLET | Freq: Two times a day (BID) | ORAL | 0 refills | Status: AC | PRN

## 2021-07-10 NOTE — Discharge Instructions (Addendum)
BladderPlease return to the ED with any new symptoms such as inability to control bowel or bladder, groin numbness, fevers ?Follow-up with PCP for any ongoing needs ?Please begin taking steroids tomorrow as I have already given you a dose for today ?Please purchase Salonpas patches for low back pain.  You may also explore ice and heat packs. ?Continue taking ibuprofen as needed for back pain. ?

## 2021-07-10 NOTE — ED Notes (Signed)
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  ?

## 2021-07-10 NOTE — ED Triage Notes (Addendum)
Pt from home c/o back pain x 1 week, no injury. Seen chiropractor on Tuesday and Thursday for decompression treatment, felt severe right back pain last night. Reports numbness/ tingling to right LE and left thigh. Ambulatory in triage. ?

## 2021-07-10 NOTE — ED Provider Notes (Signed)
?[facility]PT ?Provider Note ? ? ?CSN: [id number] ?Arrival date & time: 07/10/21  1037 ? ?  ? ?History ? ?No chief complaint on file. ? ? ?Sara Buckley is a 36 y.o. female with medical history significant for hypertension, morbid obesity, CVA.  Patient presents ED for evaluation of paraspinal low back pain that radiates down into her right anterior thigh.  Patient reports that this pain has been present for 1 week, she saw a chiropractor on Tuesday and Thursday and had adjustments made.  Patient reports that the chiropractic sessions slightly alleviated her pain however the pain never fully resolved.  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  The patient denies any groin numbness, bowel or bladder incontinence.  Patient denies any IV drug use.  Denies any fevers. ? ?HPI ? ?  ? ?Home Medications ?Prior to Admission medications   ?Medication Sig Start Date End Date Taking? Authorizing Provider  ?cyclobenzaprine (FLEXERIL) 10 MG tablet Take 1 tablet (10 mg total) by mouth 2 (two) times daily as needed for muscle spasms. 07/10/21  Yes Al DecantGroce, Markeria Goetsch F, PA-C  ?predniSONE (DELTASONE) 10 MG tablet Take 2 tablets (20 mg total) by mouth daily. 07/10/21  Yes Al DecantGroce, Elianne Gubser F, PA-C  ?amLODipine (NORVASC) 10 MG tablet Take 10 mg by mouth daily. 04/29/21   [provider]  ?atorvastatin (LIPITOR) 40 MG tablet Take 1 tablet (40 mg total) by mouth daily. 04/10/21 05/10/21  Briant CedarEzenduka, Nkeiruka J, MD  ?Calcium Carbonate-Vitamin D (OYSTER SHELL CALCIUM/D) 500-5 MG-MCG TABS Take 1 tablet by mouth daily.    [provider]  ?cholecalciferol (VITAMIN D3) 25 MCG (1000 UNIT) tablet Take 1,000 Units by mouth daily.    [provider]  ?clopidogrel (PLAVIX) 75 MG tablet Take 75 mg by mouth daily.    [provider]  ?EPINEPHrine 0.3 mg/0.3 mL IJ SOAJ injection Inject 0.3 mg into the  muscle as needed for anaphylaxis. 07/27/20   Pricilla LovelessGoldston, Scott, MD  ?ferrous sulfate 325 (65 FE) MG tablet Take 325 mg by mouth daily with breakfast.    [provider]  ?Multiple Vitamin (MULTI-VITAMIN) tablet Take 1 tablet by mouth daily.    [provider]  ?   ? ?Allergies    ?Advair hfa [fluticasone-salmeterol] and Peanut oil   ? ?Review of Systems   ?Review of Systems  ?Constitutional:  Negative for fever.  ?Musculoskeletal:  Positive for back pain.  ?Neurological:  Negative for numbness.  ?All other systems reviewed and are negative. ? ?Physical Exam ?Updated Vital Signs ?BP (!) 143/99 (BP Location: Right Arm)   Pulse (!) 109   Temp 99.1 ?F (37.3 ?C) (Oral)   Resp 18   Ht 5\' 8"  (1.727 m)   Wt (!) 140.6 kg   SpO2 99%   BMI 47.14 kg/m?  ?Physical Exam ?Vitals and nursing note reviewed.  ?Constitutional:   ?   General: She is not in acute distress. ?   Appearance: Normal appearance. She is not ill-appearing, toxic-appearing or diaphoretic.  ?HENT:  ?   Head: Normocephalic and atraumatic.  ?   Nose: Nose normal. No congestion.  ?   Mouth/Throat:  ?   Mouth: Mucous membranes are moist.  ?   Pharynx: Oropharynx is clear.  ?Eyes:  ?   Extraocular Movements: Extraocular movements intact.  ?   Conjunctiva/sclera: Conjunctivae normal.  ?  Pupils: Pupils are equal, round, and reactive to light.  ?Cardiovascular:  ?   Rate and Rhythm: Normal rate and regular rhythm.  ?Pulmonary:  ?   Effort: Pulmonary effort is normal.  ?   Breath sounds: Normal breath sounds.  ?Abdominal:  ?   General: Abdomen is flat.  ?   Palpations: Abdomen is soft.  ?   Tenderness: There is no abdominal tenderness.  ?Musculoskeletal:  ?   Cervical back: Normal range of motion and neck supple. No tenderness.  ?   Lumbar back: Tenderness present.  ?   Comments: Diffuse tenderness noted to patient lumbar spine.  ?Skin: ?   General: Skin is warm and dry.  ?   Capillary Refill: Capillary refill takes less than 2 seconds.   ?Neurological:  ?   General: No focal deficit present.  ?   Mental Status: She is alert and oriented to person, place, and time.  ?   GCS: GCS eye subscore is 4. GCS verbal subscore is 5. GCS motor subscore is 6.  ?   Cranial Nerves: Cranial nerves 2-12 are intact. No cranial nerve deficit.  ?   Sensory: Sensation is intact. No sensory deficit.  ?   Motor: Motor function is intact. No weakness.  ?   Coordination: Coordination is intact. Heel to [facility] Test normal.  ?   Comments: 5 out of 5 strength noted to bilateral lower extremities.  Patient is neurovascularly intact.  ? ? ?ED Results / Procedures / Treatments   ?Labs ?(all labs ordered are listed, but only abnormal results are displayed) ?Labs Reviewed - No data to display ? ?EKG ?None ? ?Radiology ?No results found. ? ?Procedures ?Procedures  ? ? ?Medications Ordered in ED ?Medications  ?lidocaine (LIDODERM) 5 % 1 patch (has no administration in time range)  ?cyclobenzaprine (FLEXERIL) tablet 10 mg (10 mg Oral Given 07/10/21 1223)  ?ibuprofen (ADVIL) tablet 600 mg (600 mg Oral Given 07/10/21 1223)  ?predniSONE (DELTASONE) tablet 40 mg (40 mg Oral Given 07/10/21 1223)  ? ? ?ED Course/ Medical Decision Making/ A&P ?  ?                        ?Medical Decision Making ?Risk ?Prescription drug management. ? ? ?36 year old female presents ED for evaluation of low back pain.  Please see HPI for further details of the event. ? ?On examination, the patient is afebrile, nontachycardic but not hypoxic.  Patient lung sounds are clear bilaterally.  Patient abdomen soft compressible all 4 quadrants.  Patient is ambulating around the room in no apparent distress. ? ?Patient states that she recently had x-ray done on Tuesday for back of PCP, I do not see the need to repeat imaging due to the fact that there is atraumatic low back pain.  The patient denies any recent trauma, injury. ? ?In the department, the patient was provided with 10 mg Flexeril, 40 mg prednisone at the patient's  request, 6 and milligrams ibuprofen.  Advised the patient that research shows that steroids do not typically help low back pain but she insisted on attempting. ? ?After medications were administered, the patient states that she can "feel the pain coming down".  The patient will be sent home with muscle relaxers, 3 days of steroid burst.  Patient will be advised to follow-up with PCP.  Patient given return precautions and she voiced understanding of these.  Patient had all of her questions answered to her satisfaction.  I provided the patient with a one-time lidocaine patch prior to discharge.  Patient advised to purchase Salonpas patches as an outpatient.  Patient agreeable to plan.  Patient understanding of instructions.  Patient stable for discharge. ? ? ?Final Clinical Impression(s) / ED Diagnoses ?Final diagnoses:  ?Midline low back pain with right-sided sciatica, unspecified chronicity  ? ? ?Rx / DC Orders ?ED Discharge Orders   ? ?      Ordered  ?  cyclobenzaprine (FLEXERIL) 10 MG tablet  2 times daily PRN       ? 07/10/21 1303  ?  predniSONE (DELTASONE) 10 MG tablet  Daily       ? 07/10/21 1303  ? ?  ?  ? ?  ? ? ?  ?Al Decant, PA-C ?07/10/21 1307 ? ?  ?Maia Plan, MD ?07/13/21 1008 ? ?

## 2021-07-10 NOTE — ED Notes (Signed)
Pt teaching provided on medications that may cause drowsiness. Pt instructed not to drive or operate heavy machinery while taking the prescribed medication. Pt verbalized understanding.   Pt provided discharge instructions and prescription information. Pt was given the opportunity to ask questions and questions were answered.
